# Patient Record
Sex: Female | Born: 1970 | Race: White | Hispanic: No | Marital: Single | Smoking: Former smoker
Health system: Southern US, Community
[De-identification: ages and names within clinical notes are randomized; demographics above are authoritative.]

## PROBLEM LIST (undated history)

## (undated) DIAGNOSIS — I10 Essential (primary) hypertension: Secondary | ICD-10-CM

## (undated) DIAGNOSIS — E039 Hypothyroidism, unspecified: Secondary | ICD-10-CM

## (undated) DIAGNOSIS — G35 Multiple sclerosis: Secondary | ICD-10-CM

## (undated) DIAGNOSIS — E785 Hyperlipidemia, unspecified: Secondary | ICD-10-CM

## (undated) DIAGNOSIS — E7800 Pure hypercholesterolemia, unspecified: Secondary | ICD-10-CM

## (undated) DIAGNOSIS — K219 Gastro-esophageal reflux disease without esophagitis: Secondary | ICD-10-CM

## (undated) DIAGNOSIS — K2950 Unspecified chronic gastritis without bleeding: Secondary | ICD-10-CM

## (undated) HISTORY — PX: ABDOMINAL HYSTERECTOMY: SHX81

## (undated) HISTORY — PX: TUBAL LIGATION: SHX77

## (undated) HISTORY — PX: PARTIAL HYSTERECTOMY: SHX80

---

## 2006-03-30 ENCOUNTER — Emergency Department: Payer: Self-pay | Admitting: Emergency Medicine

## 2006-09-01 ENCOUNTER — Ambulatory Visit: Payer: Self-pay | Admitting: Family Medicine

## 2006-09-03 ENCOUNTER — Ambulatory Visit: Payer: Self-pay | Admitting: Family Medicine

## 2008-01-06 ENCOUNTER — Ambulatory Visit: Payer: Self-pay | Admitting: Family Medicine

## 2008-12-21 ENCOUNTER — Ambulatory Visit: Payer: Self-pay | Admitting: Urology

## 2008-12-26 ENCOUNTER — Ambulatory Visit: Payer: Self-pay | Admitting: Urology

## 2009-01-08 ENCOUNTER — Emergency Department: Payer: Self-pay | Admitting: Unknown Physician Specialty

## 2009-02-09 ENCOUNTER — Emergency Department: Payer: Self-pay | Admitting: Emergency Medicine

## 2010-02-01 ENCOUNTER — Ambulatory Visit: Payer: Self-pay | Admitting: Unknown Physician Specialty

## 2010-02-06 LAB — PATHOLOGY REPORT

## 2010-04-05 ENCOUNTER — Ambulatory Visit: Payer: Self-pay | Admitting: Neurology

## 2011-01-21 ENCOUNTER — Ambulatory Visit: Payer: Self-pay | Admitting: Family Medicine

## 2011-01-24 ENCOUNTER — Ambulatory Visit: Payer: Self-pay | Admitting: Family Medicine

## 2011-03-29 ENCOUNTER — Emergency Department: Payer: Self-pay | Admitting: *Deleted

## 2011-03-29 LAB — COMPREHENSIVE METABOLIC PANEL
Bilirubin,Total: 0.3 mg/dL (ref 0.2–1.0)
Chloride: 110 mmol/L — ABNORMAL HIGH (ref 98–107)
Co2: 23 mmol/L (ref 21–32)
Creatinine: 1.01 mg/dL (ref 0.60–1.30)
EGFR (African American): >60
EGFR (Non-African Amer.): >60
Osmolality: 283 (ref 275–301)
Potassium: 4 mmol/L (ref 3.5–5.1)
Sodium: 142 mmol/L (ref 136–145)
Total Protein: 7.4 g/dL (ref 6.4–8.2)

## 2011-03-29 LAB — CBC
HCT: 42.8 % (ref 35.0–47.0)
MCV: 85 fL (ref 80–100)
Platelet: 273 10*3/uL (ref 150–440)
RBC: 5.01 10*6/uL (ref 3.80–5.20)
WBC: 11.1 10*3/uL — ABNORMAL HIGH (ref 3.6–11.0)

## 2011-07-29 ENCOUNTER — Ambulatory Visit: Payer: Self-pay | Admitting: Family Medicine

## 2011-08-19 ENCOUNTER — Ambulatory Visit: Payer: Self-pay | Admitting: Family Medicine

## 2011-08-30 ENCOUNTER — Other Ambulatory Visit: Payer: Self-pay | Admitting: Podiatry

## 2011-09-02 ENCOUNTER — Other Ambulatory Visit: Payer: Self-pay | Admitting: Family Medicine

## 2011-09-02 DIAGNOSIS — N6009 Solitary cyst of unspecified breast: Secondary | ICD-10-CM

## 2011-09-06 ENCOUNTER — Ambulatory Visit
Admission: RE | Admit: 2011-09-06 | Discharge: 2011-09-06 | Disposition: A | Discharge status: Home / Self Care | Payer: Medicaid Other | Source: Ambulatory Visit | Attending: Family Medicine | Admitting: Family Medicine

## 2011-09-06 DIAGNOSIS — N6009 Solitary cyst of unspecified breast: Secondary | ICD-10-CM

## 2011-09-06 MED ORDER — GADOBENATE DIMEGLUMINE 529 MG/ML IV SOLN
20.0000 mL | Freq: Once | INTRAVENOUS | Status: AC | PRN
  Administered 2011-09-06: 20 mL via INTRAVENOUS

## 2011-10-09 ENCOUNTER — Emergency Department: Payer: Self-pay | Admitting: Emergency Medicine

## 2012-01-13 ENCOUNTER — Ambulatory Visit: Payer: Self-pay | Admitting: Obstetrics & Gynecology

## 2012-01-23 ENCOUNTER — Ambulatory Visit: Payer: Self-pay | Admitting: Obstetrics & Gynecology

## 2012-01-30 ENCOUNTER — Ambulatory Visit: Payer: Self-pay | Admitting: Family Medicine

## 2014-05-31 NOTE — Op Note (Signed)
PATIENT NAME:  Katrina Murray, Katrina Murray MR#:  [id number] DATE OF BIRTH:  [birth year]  DATE OF PROCEDURE:  01/23/2012  PREOPERATIVE DIAGNOSIS: Severe dysmenorrhea.  POSTOPERATIVE DIAGNOSIS: Severe dysmenorrhea.  PROCEDURE PERFORMED: Diagnostic laparoscopy and vaginal hysterectomy.   INDICATION: A 44 year old multiparous with long history of severe dysmenorrhea.  SURGEON: Verlin Grills, MD  ASSISTANT: Vena Austria, MD  ANESTHESIA: General endotracheal.   COMPLICATIONS: None.   ESTIMATED BLOOD LOSS: 100 mL.  IV FLUIDS: 1700 mL crystalloid.   URINE OUTPUT: 250 mL, clear.  SPECIMEN: Uterus and cervix.   FINDINGS: Small anteverted uterus, normal tubes and ovaries bilaterally. No evidence of endometriosis, adhesive disease, or other etiology for pelvic pain.   MEDICATIONS: 2 grams Ancef preoperatively.  PROCEDURE IN DETAIL: The patient was taken to the Operating Room, laid supine on the operating room table, and given anesthesia via general endotracheal. She had her legs placed in [place] stirrups. She was prepped and draped in sterile fashion. Foley catheter was inserted. Speculum was inserted and the cervix was visualized and grasped anteriorly with a single-tooth tenaculum. Hulka uterine manipulator was then placed without difficulty. Speculum and tenaculum were both removed from the vagina and attention was turned to the patient's abdomen. A 5 mm incision was made vertically in the umbilical plate, with a knife, after injection of local anesthesia. Next, a Veress needle was used in order to attempt insufflation, although due to recurrent elevated entry pressure twice, decision was made to then proceed with a Visiport. The peritoneal cavity was then entered without complication using the Visiport. The abdomen was surveyed and found to have no injury or defect. Next, the uterus was visualized, noted to be normal, as well as bilateral tubes and ovaries. There was no evidence of endometriosis  or other pelvic adhesive disease. At this point, decision was made to proceed with vaginal hysterectomy. The camera was removed from the abdomen, although the trocar remained in place. Next, attention was turned to the vagina where a short-weighted speculum was placed. The cervix was grasped doubly with a single-tooth tenaculum. The cervical vaginal junction was then injected with local anesthetic containing epinephrine, circumferentially. Next, the knife was used circumferentially to incise the cervicovaginal junction. The anterior vaginal mucosa was elevated and with sharp dissection using Mayo scissors anterior entry into the peritoneal cavity was accomplished without difficulty. A lighted retractor was then placed into this opening and attention was turned to the posterior aspect and using Mayo scissors posterior colpotomy was made, again without difficulty, and a long-weighted speculum was placed. Uterosacrals were then identified bilaterally. They were clamped, cut, and suture ligated bilaterally and tags were placed. Next, the uterine pedicles were then isolated medial to previous pedicle serially up the mesosalpinx with clamping, cutting, and suture ligating until we arrived at the bilateral cornua which were isolated on either side. They were clamped, cut, and tied with free tie on a pass followed by fore and aft stitch bilaterally. Hemostasis was noted to be excellent at all sites and pedicles. Next, a modified McCall's was carried out with previously tagged uterosacrals and running of posterior peritoneum. This was held and the vaginal mucosa was then closed in a running locked fashion followed by tying off of the modified McCall's stitch. Vaginal packing was placed and attention was then returned to the abdomen. The camera was reinserted and the pelvis was again reinspected. All pedicles were found to be hemostatic. Trocar and camera were removed from the abdomen. Insufflation was allowed to release.  Skin was  closed using Dermabond and bandage with gauze. All counts were correct x2. The patient tolerated the procedure well and was taken to the [facility] unit in stable condition.  ____________________________ Ali Lowe Garnette Gunner, MD eks:slb D: 01/23/2012 10:10:10 ET    T: 01/23/2012 10:24:30 ET       JOB#: [id number] cc: Weston Settle K. Garnette Gunner, MD, <Dictator> Weston Settle Lona Kettle CLIPP MD ELECTRONICALLY SIGNED 01/28/2012 11:47

## 2015-06-06 ENCOUNTER — Other Ambulatory Visit: Payer: Self-pay | Admitting: Internal Medicine

## 2015-06-06 DIAGNOSIS — Z1231 Encounter for screening mammogram for malignant neoplasm of breast: Secondary | ICD-10-CM

## 2015-08-25 ENCOUNTER — Other Ambulatory Visit: Payer: Self-pay | Admitting: Internal Medicine

## 2015-08-25 DIAGNOSIS — Z1231 Encounter for screening mammogram for malignant neoplasm of breast: Secondary | ICD-10-CM

## 2015-08-29 ENCOUNTER — Ambulatory Visit
Admission: RE | Admit: 2015-08-29 | Discharge: 2015-08-29 | Disposition: A | Discharge status: Home / Self Care | Payer: Medicaid Other | Source: Ambulatory Visit | Attending: Internal Medicine | Admitting: Internal Medicine

## 2015-08-29 DIAGNOSIS — Z1231 Encounter for screening mammogram for malignant neoplasm of breast: Secondary | ICD-10-CM

## 2015-09-11 ENCOUNTER — Ambulatory Visit (INDEPENDENT_AMBULATORY_CARE_PROVIDER_SITE_OTHER): Payer: Medicaid Other | Admitting: Otolaryngology

## 2015-10-09 ENCOUNTER — Ambulatory Visit (INDEPENDENT_AMBULATORY_CARE_PROVIDER_SITE_OTHER): Payer: Medicaid Other | Admitting: Otolaryngology

## 2015-10-09 DIAGNOSIS — H903 Sensorineural hearing loss, bilateral: Secondary | ICD-10-CM

## 2015-10-09 DIAGNOSIS — H9313 Tinnitus, bilateral: Secondary | ICD-10-CM

## 2016-01-17 ENCOUNTER — Emergency Department
Admission: EM | Admit: 2016-01-17 | Discharge: 2016-01-17 | Disposition: A | Discharge status: Home / Self Care | Payer: Medicaid Other | Attending: Student in an Organized Health Care Education/Training Program | Admitting: Student in an Organized Health Care Education/Training Program

## 2016-01-17 ENCOUNTER — Emergency Department: Payer: Medicaid Other

## 2016-01-17 ENCOUNTER — Encounter: Payer: Self-pay | Admitting: Emergency Medicine

## 2016-01-17 DIAGNOSIS — Z87891 Personal history of nicotine dependence: Secondary | ICD-10-CM | POA: Insufficient documentation

## 2016-01-17 DIAGNOSIS — R1012 Left upper quadrant pain: Secondary | ICD-10-CM | POA: Diagnosis not present

## 2016-01-17 DIAGNOSIS — R109 Unspecified abdominal pain: Secondary | ICD-10-CM

## 2016-01-17 DIAGNOSIS — R110 Nausea: Secondary | ICD-10-CM | POA: Diagnosis not present

## 2016-01-17 DIAGNOSIS — I10 Essential (primary) hypertension: Secondary | ICD-10-CM | POA: Diagnosis not present

## 2016-01-17 HISTORY — DX: Essential (primary) hypertension: I10

## 2016-01-17 HISTORY — DX: Pure hypercholesterolemia, unspecified: E78.00

## 2016-01-17 HISTORY — DX: Multiple sclerosis: G35

## 2016-01-17 LAB — GLUCOSE, CAPILLARY: GLUCOSE-CAPILLARY: 144 mg/dL — AB (ref 65–99)

## 2016-01-17 LAB — COMPREHENSIVE METABOLIC PANEL
ALK PHOS: 55 U/L (ref 38–126)
ALT: 26 U/L (ref 14–54)
AST: 28 U/L (ref 15–41)
Albumin: 4.9 g/dL (ref 3.5–5.0)
Anion gap: 13 (ref 5–15)
BILIRUBIN TOTAL: 0.6 mg/dL (ref 0.3–1.2)
BUN: 18 mg/dL (ref 6–20)
CALCIUM: 10 mg/dL (ref 8.9–10.3)
CO2: 22 mmol/L (ref 22–32)
CREATININE: 1.49 mg/dL — AB (ref 0.44–1.00)
Chloride: 99 mmol/L — ABNORMAL LOW (ref 101–111)
GFR, EST AFRICAN AMERICAN: 48 mL/min — AB (ref >60)
GFR, EST NON AFRICAN AMERICAN: 41 mL/min — AB (ref >60)
Glucose, Bld: 132 mg/dL — ABNORMAL HIGH (ref 65–99)
Potassium: 3.6 mmol/L (ref 3.5–5.1)
Sodium: 134 mmol/L — ABNORMAL LOW (ref 135–145)
TOTAL PROTEIN: 8.3 g/dL — AB (ref 6.5–8.1)

## 2016-01-17 LAB — URINALYSIS, COMPLETE (UACMP) WITH MICROSCOPIC
Bacteria, UA: NONE SEEN
Bilirubin Urine: NEGATIVE
Glucose, UA: NEGATIVE mg/dL
Ketones, ur: 5 mg/dL — AB
LEUKOCYTES UA: NEGATIVE
NITRITE: NEGATIVE
PH: 5 (ref 5.0–8.0)
Protein, ur: 30 mg/dL — AB

## 2016-01-17 LAB — CBC
HCT: 52.7 % — ABNORMAL HIGH (ref 35.0–47.0)
Hemoglobin: 18.1 g/dL — ABNORMAL HIGH (ref 12.0–16.0)
MCH: 28.4 pg (ref 26.0–34.0)
MCHC: 34.3 g/dL (ref 32.0–36.0)
MCV: 82.7 fL (ref 80.0–100.0)
PLATELETS: 336 10*3/uL (ref 150–440)
RBC: 6.38 MIL/uL — AB (ref 3.80–5.20)
RDW: 13.8 % (ref 11.5–14.5)
WBC: 11.7 10*3/uL — AB (ref 3.6–11.0)

## 2016-01-17 LAB — LIPASE, BLOOD: LIPASE: 25 U/L (ref 11–51)

## 2016-01-17 MED ORDER — TRAMADOL HCL 50 MG PO TABS: 50.0000 mg | ORAL_TABLET | Freq: Two times a day (BID) | ORAL | 0 refills | Status: DC | PRN

## 2016-01-17 MED ORDER — IOPAMIDOL (ISOVUE-300) INJECTION 61%
75.0000 mL | Freq: Once | INTRAVENOUS | Status: AC | PRN
  Administered 2016-01-17: 75 mL via INTRAVENOUS
  Filled 2016-01-17: qty 75

## 2016-01-17 MED ORDER — ONDANSETRON HCL 4 MG PO TABS: 4.0000 mg | ORAL_TABLET | Freq: Every day | ORAL | 0 refills | Status: DC

## 2016-01-17 MED ORDER — SODIUM CHLORIDE 0.9 % IV BOLUS (SEPSIS)
1000.0000 mL | Freq: Once | INTRAVENOUS | Status: AC
  Administered 2016-01-17: 1000 mL via INTRAVENOUS

## 2016-01-17 MED ORDER — MORPHINE SULFATE (PF) 2 MG/ML IV SOLN
2.0000 mg | Freq: Once | INTRAVENOUS | Status: AC
  Administered 2016-01-17: 2 mg via INTRAVENOUS
  Filled 2016-01-17: qty 1

## 2016-01-17 MED ORDER — DOXYLAMINE SUCCINATE (SLEEP) 25 MG PO TABS
25.0000 mg | ORAL_TABLET | Freq: Once | ORAL | Status: DC
Start: 2016-01-17 — End: 2016-01-17

## 2016-01-17 MED ORDER — ONDANSETRON 4 MG PO TBDP
4.0000 mg | ORAL_TABLET | Freq: Once | ORAL | Status: AC
  Administered 2016-01-17: 4 mg via ORAL
  Filled 2016-01-17: qty 1

## 2016-01-17 NOTE — ED Triage Notes (Signed)
Pt presents with n/v and abdominal pain x 3-4 days. Denies diarrhea. Pt states it is worse today. She states that she was able to keep one meal down yesterday and then one meal today. This morning began to have back pain as well. Pt alert & oriented.

## 2016-01-17 NOTE — ED Notes (Signed)
PA aware of BP, pt has not taken BP meds today, PA aware

## 2016-01-17 NOTE — ED Provider Notes (Signed)
[facility] Emergency Department Provider Note  ____________________________________________   First MD Initiated Contact with Patient 01/17/16 1406     (approximate)  I have reviewed the triage vital signs and the nursing notes.   HISTORY  Chief Complaint Abdominal Pain    HPI Katrina Murray is a 45 y.o. female presenting with acute sharp 10/10 left upper quadrant abdominal pain for one day. Patient denies preceding factors. Associated symptoms include nausea. Patient states that she had rhinorrhea and cough for the past 3 days. She has not experienced similar episodes of abdominal pain in the past. Past abdominal surgeries include a partial hysterectomy and tubal ligation. No history of malignancy. Abdominal pain does not change with eating or changes in position. She has not attempted alleviating measures to relieve abdominal pain. Patient states that she consumes alcohol once monthly.  She denies hematemesis, hematuria, hematochezia, diarrhea, constipation and fever. She denies recent travel.   Past Medical History:  Diagnosis Date  . Hypercholesteremia   . Hypertension   . Multiple sclerosis (HCC)     There are no active problems to display for this patient.   Past Surgical History:  Procedure Laterality Date  . PARTIAL HYSTERECTOMY    . TUBAL LIGATION      Prior to Admission medications   Medication Sig Start Date End Date Taking? Authorizing Provider  ondansetron (ZOFRAN) 4 MG tablet Take 1 tablet (4 mg total) by mouth daily. 01/17/16   Orvil Feil, PA-C  traMADol (ULTRAM) 50 MG tablet Take 1 tablet (50 mg total) by mouth every 12 (twelve) hours as needed. 01/17/16 01/16/17  Orvil Feil, PA-C    Allergies Patient has no known allergies.  Family History  Problem Relation Age of Onset  . Breast cancer Maternal Aunt     Social History Social History  Substance Use Topics  . Smoking status: Former Games developer  . Smokeless tobacco:  Never Used  . Alcohol use Not on file     Comment: occasional    Review of Systems Constitutional: No fever/chills Eyes: No visual changes. ENT: No sore throat. Cardiovascular: Denies chest pain. Respiratory: Denies shortness of breath. Gastrointestinal: Has LUQ abdominal pain. Nausea but no vomiting. Genitourinary: Negative for dysuria. Musculoskeletal: Negative for back pain. Skin: Negative for rash. Neurological: Negative for headaches, focal weakness or numbness.  10-point ROS otherwise negative.  ____________________________________________   PHYSICAL EXAM:  VITAL SIGNS: ED Triage Vitals  Enc Vitals Group     BP 01/17/16 1216 (!) 131/111     Pulse Rate 01/17/16 1216 (!) 126     Resp 01/17/16 1216 20     Temp 01/17/16 1216 97.6 F (36.4 C)     Temp Source 01/17/16 1216 Oral     SpO2 01/17/16 1216 96 %     Weight 01/17/16 1217 277 lb (125.6 kg)     Height 01/17/16 1217 5\' 9"  (1.753 m)     Head Circumference --      Peak Flow --      Pain Score 01/17/16 1217 9     Pain Loc --      Pain Edu? --      Excl. in GC? --    Constitutional: Patient is lying supine on the bed with her head covered by a blanket.  Eyes: Conjunctivae are normal. PERRL. EOMI. photophobia, secondary to MS. Head: Atraumatic. Nose: Nasal septum midline. Turbinates erythematous bilaterally. Mouth/Throat: Mucous membranes are moist.  Posterior pharynx is without erythema, exudate  or petechiae. Tonsils 2+, uvula midline. Neck: Full range of motion. No palpable cervical lymphadenopathy. Cardiovascular: Palpable radial, ulnar and dorsalis pedal pulses. Regular rate and rhythm without gallops, murmurs or rubs. Respiratory: Normal respiratory effort.  No retractions. Lungs CTAB. Gastrointestinal: Mild striae visualized. Patient has tenderness to light palpation in the left upper quadrant with guarding. Positive bowel sounds in all 4 quadrants. No tenderness in the right and left lower quadrants. No  venous hums auscultated. No palpable spleen. Genitourinary: No suprapubic or costovertebral angle tenderness. Musculoskeletal: No lower extremity tenderness nor edema.  No joint effusions. Neurologic:  Normal speech and language.  Skin:  Skin is warm, dry and intact. No rash noted.  ____________________________________________   LABS (all labs ordered are listed, but only abnormal results are displayed)  Labs Reviewed  COMPREHENSIVE METABOLIC PANEL - Abnormal; Notable for the following:       Result Value   Sodium 134 (*)    Chloride 99 (*)    Glucose, Bld 132 (*)    Creatinine, Ser 1.49 (*)    Total Protein 8.3 (*)    GFR calc non Af Amer 41 (*)    GFR calc Af Amer 48 (*)    All other components within normal limits  CBC - Abnormal; Notable for the following:    WBC 11.7 (*)    RBC 6.38 (*)    Hemoglobin 18.1 (*)    HCT 52.7 (*)    All other components within normal limits  GLUCOSE, CAPILLARY - Abnormal; Notable for the following:    Glucose-Capillary 144 (*)    All other components within normal limits  LIPASE, BLOOD  URINALYSIS, COMPLETE (UACMP) WITH MICROSCOPIC   ____________________________________________   RADIOLOGY  DG Chest Normal chest.  CT Abdomen  IMPRESSION:  No acute finding to explain abdominal pain, nausea or vomiting.    Multiple benign appearing liver cysts. Few benign appearing renal  cysts.    Aortic atherosclerosis.    3 cm cyst either of the vagina or the periurethral region. This  could also be a urethral diverticulum.    Chronic bilateral pars defects at L5.     I, Orvil Feil, personally viewed and evaluated these images as part of my medical decision making, as well as reviewing the written report by the radiologist.   PROCEDURES  Procedures Morphine  IV bolus - Normal Saline  Zofran    INITIAL IMPRESSION / ASSESSMENT AND PLAN / ED COURSE  Pertinent labs & imaging results that were available during my care of the  patient were reviewed by me and considered in my medical decision making (see chart for details).    Clinical Course    Assessment and plan: Patient presents with 10 out of 10 left upper quadrant abdominal pain.Patient is tachycardic and hypertensive with a WBC 11.7. Differential diagnosis originally included pancreatitis, diverticulitis, bowel obstruction and gastroenteritis. Lipase within reference range, decreasing suspicion for pancreatitis. Patient underwent a CT abdomen which did not present findings to explain abdominal pain, nausea or vomiting. DG chest was within the parameters of normal. Patient received fluids, Zofran and an an injection of morphine in the emergency department.   ----------------------------------------- 4:32 PM on 01/17/2016 -----------------------------------------  I checked on patient after morphine injection. Patient states that abdominal pain is 0 out of 10. Patient was advised to follow-up with her primary care provider regarding likely benign liver cyst localized on CT abdomen. Patient was discharged with  a five-day course of tramadol and Zofran to  be used as needed for pain and nausea. Strict return precautions were given.   ----------------------------------------- 4:55 PM on 01/17/2016 -----------------------------------------  I consulted with Zollie Scalelivia, RN collaborating team member for patient care. Heart rate is now within normal range. Patient remains hypertensive. However she has hypertension at baseline and normally takes her blood pressure medication at night. Vital signs are otherwise stable.  ----------------------------------------- 5:24 PM on 01/17/2016 -----------------------------------------  I just checked on patient. Her abdominal pain is still 0 out of 10. Encouraged hydration at home given UA results.   FINAL CLINICAL IMPRESSION(S) / ED DIAGNOSES  Final diagnoses:  Left upper quadrant pain      NEW MEDICATIONS STARTED DURING  THIS VISIT:  New Prescriptions   ONDANSETRON (ZOFRAN) 4 MG TABLET    Take 1 tablet (4 mg total) by mouth daily.   TRAMADOL (ULTRAM) 50 MG TABLET    Take 1 tablet (50 mg total) by mouth every 12 (twelve) hours as needed.     Note:  This document was prepared using Dragon voice recognition software and may include unintentional dictation errors.    Orvil FeilJaclyn M Smayan Hackbart, PA-C 01/17/16 1755    Willy EddyPatrick Robinson, MD 01/17/16 [phone]

## 2016-01-17 NOTE — ED Notes (Signed)
Pt states nasal congestion with cough for several days, states abd pain that began yesterday with nausea, denies any vomiting, pt awake and alert in no acute distress

## 2016-02-26 ENCOUNTER — Ambulatory Visit: Payer: Medicaid Other | Admitting: Urology

## 2016-02-26 ENCOUNTER — Encounter: Payer: Self-pay | Admitting: Urology

## 2016-02-26 VITALS — BP 122/80 | HR 62 | Ht 69.0 in | Wt 273.0 lb

## 2016-02-26 DIAGNOSIS — N393 Stress incontinence (female) (male): Secondary | ICD-10-CM

## 2016-02-26 DIAGNOSIS — N3946 Mixed incontinence: Secondary | ICD-10-CM

## 2016-02-26 LAB — MICROSCOPIC EXAMINATION: BACTERIA UA: NONE SEEN

## 2016-02-26 LAB — URINALYSIS, COMPLETE
BILIRUBIN UA: NEGATIVE
Glucose, UA: NEGATIVE
Ketones, UA: NEGATIVE
Leukocytes, UA: NEGATIVE
Nitrite, UA: NEGATIVE
PH UA: 6 (ref 5.0–7.5)
Protein, UA: NEGATIVE
Urobilinogen, Ur: 0.2 mg/dL (ref 0.2–1.0)

## 2016-02-26 LAB — BLADDER SCAN AMB NON-IMAGING: Scan Result: 19

## 2016-02-26 NOTE — Progress Notes (Signed)
02/26/2016 10:50 AM   Katrina Murray Nov 16, 1970 [id number]  Referring provider: [facility] [address] [place], [place] [address]  Chief Complaint  Patient presents with  . Urinary Incontinence    HPI: I was consulted to assess the patient's urinary incontinence worsening over many years. She reports some sort of procedure but no surgery by Dr. Sheppard Penton in the past.  Currently she leaks with coughing sneezing and sometimes bending and lifting. She has urge incontinence. She believes a stress incontinence is worse. She does have mild to moderate bedwetting. She wears 3 pads a day sometimes almost moderately wet.  She voids 4-5 times a day with no ankle edema but drinks a lot of fluids. She voids every 2 hours and has a reasonable flow and does feel empty  She was diagnosed with multiple sclerosis in 2003 and does well with that. She has had a hysterectomy  Her bowel movements are normal. She does not give bladder infections. The presentation has not been medically treated     PMH: Past Medical History:  Diagnosis Date  . Hypercholesteremia   . Hypertension   . Multiple sclerosis [facility])     Surgical History: Past Surgical History:  Procedure Laterality Date  . ABDOMINAL HYSTERECTOMY    . PARTIAL HYSTERECTOMY    . TUBAL LIGATION      Home Medications:  Allergies as of 02/26/2016   No Known Allergies     Medication List       Accurate as of 02/26/16 10:50 AM. Always use your most recent med list.          glatiramer 20 MG/ML Sosy injection Commonly known as:  COPAXONE Inject 20 mg into the skin 3 (three) times a week.   hydrochlorothiazide 25 MG tablet Commonly known as:  HYDRODIURIL Take 25 mg by mouth daily.   levothyroxine 25 MCG tablet Commonly known as:  SYNTHROID, LEVOTHROID Take by mouth.   lisinopril 40 MG tablet Commonly known as:  PRINIVIL,ZESTRIL Take by mouth.   omeprazole 20 MG capsule Commonly known as:   PRILOSEC Take by mouth.   ondansetron 4 MG tablet Commonly known as:  ZOFRAN Take 1 tablet (4 mg total) by mouth daily.   simvastatin 40 MG tablet Commonly known as:  ZOCOR Take by mouth.   topiramate 25 MG tablet Commonly known as:  TOPAMAX Take by mouth.   traMADol 50 MG tablet Commonly known as:  ULTRAM Take 1 tablet (50 mg total) by mouth every 12 (twelve) hours as needed.       Allergies: No Known Allergies  Family History: Family History  Problem Relation Age of Onset  . Breast cancer Maternal Aunt   . Bladder Cancer Neg Hx   . Kidney cancer Neg Hx     Social History:  reports that she has quit smoking. She has never used smokeless tobacco. She reports that she does not drink alcohol or use drugs.  ROS: UROLOGY Frequent Urination?: Yes Hard to postpone urination?: No Burning/pain with urination?: No Get up at night to urinate?: Yes Leakage of urine?: Yes Urine stream starts and stops?: No Trouble starting stream?: No Do you have to strain to urinate?: No Blood in urine?: No Urinary tract infection?: No Sexually transmitted disease?: No Injury to kidneys or bladder?: Yes Painful intercourse?: No Weak stream?: Yes Currently pregnant?: Yes Vaginal bleeding?: No Last menstrual period?: n  Gastrointestinal Nausea?: Yes Vomiting?: No Indigestion/heartburn?: Yes Diarrhea?: No Constipation?: No  Constitutional  Fever: No Night sweats?: No Weight loss?: No Fatigue?: No  Skin Skin rash/lesions?: No Itching?: No  Eyes Blurred vision?: Yes Double vision?: No  Ears/Nose/Throat Sore throat?: No Sinus problems?: No  Hematologic/Lymphatic Swollen glands?: No Easy bruising?: Yes  Cardiovascular Leg swelling?: No Chest pain?: No  Respiratory Cough?: No Shortness of breath?: No  Endocrine Excessive thirst?: Yes  Musculoskeletal Back pain?: Yes Joint pain?: Yes  Neurological Headaches?: Yes Dizziness?:  No  Psychologic Depression?: No Anxiety?: No  Physical Exam: BP 122/80 (BP Location: Left Arm, Patient Position: Sitting, Cuff Size: Large)   Pulse 62   Ht 5\' 9"  (1.753 m)   Wt 273 lb (123.8 kg)   BMI 40.32 kg/m   Constitutional:  Alert and oriented, No acute distress. HEENT: [place] AT, moist mucus membranes.  Trachea midline, no masses. Cardiovascular: No clubbing, cyanosis, or edema. Respiratory: Normal respiratory effort, no increased work of breathing. GI: Abdomen is soft, nontender, nondistended, no abdominal masses GU: No CVA tenderness. The patient had mild grade 2 hypermobility of the bladder neck and no stress incontinence. She had a grade 1 high cystocele. She had an asymptomatic grade 1 distal rectocele. Skin: No rashes, bruises or suspicious lesions. Lymph: No cervical or inguinal adenopathy. Neurologic: Grossly intact, no focal deficits, moving all 4 extremities. Psychiatric: Normal mood and affect.  Laboratory Data: Lab Results  Component Value Date   WBC 11.7 (H) 01/17/2016   HGB 18.1 (H) 01/17/2016   HCT 52.7 (H) 01/17/2016   MCV 82.7 01/17/2016   PLT 336 01/17/2016    Lab Results  Component Value Date   CREATININE 1.49 (H) 01/17/2016    No results found for: PSA  No results found for: TESTOSTERONE  No results found for: HGBA1C  Urinalysis    Component Value Date/Time   COLORURINE YELLOW (A) 01/17/2016 1642   APPEARANCEUR CLEAR (A) 01/17/2016 1642   LABSPEC >1.046 (H) 01/17/2016 1642   PHURINE 5.0 01/17/2016 1642   GLUCOSEU NEGATIVE 01/17/2016 1642   HGBUR MODERATE (A) 01/17/2016 1642   BILIRUBINUR NEGATIVE 01/17/2016 1642   KETONESUR 5 (A) 01/17/2016 1642   PROTEINUR 30 (A) 01/17/2016 1642   NITRITE NEGATIVE 01/17/2016 1642   LEUKOCYTESUR NEGATIVE 01/17/2016 1642    Pertinent Imaging: none  Assessment & Plan:  The patient has mixed incontinence. She has bedwetting. She has multiple sclerosis and is at high risk of a neurogenic bladder. She  has significant nighttime frequency and milder daytime frequency.  The physiology of urinary incontinence was discussed. The role of urodynamics was discussed. She does have significant stress incontinence but with multiple sclerosis this may be less ideal of a therapy for her. There is no question she has an overactive bladder  UDS ordered  1. Stress incontinence, female 2. Mixed incontinence 3. Neurogenic bladder  - Bladder Scan (Post Void Residual) in office - Urinalysis, Complete   No Follow-up on file.  Martina Sinner, MD  [facility] [address], [address] [place], [place] [address] [phone]

## 2016-03-05 ENCOUNTER — Other Ambulatory Visit: Payer: Self-pay | Admitting: Internal Medicine

## 2016-03-06 ENCOUNTER — Other Ambulatory Visit: Payer: Self-pay | Admitting: Internal Medicine

## 2016-03-06 DIAGNOSIS — R1010 Upper abdominal pain, unspecified: Secondary | ICD-10-CM

## 2016-03-11 ENCOUNTER — Other Ambulatory Visit: Payer: Self-pay | Admitting: Urology

## 2016-03-13 ENCOUNTER — Ambulatory Visit
Admission: RE | Admit: 2016-03-13 | Discharge: 2016-03-13 | Disposition: A | Discharge status: Home / Self Care | Payer: Medicaid Other | Source: Ambulatory Visit | Attending: Internal Medicine | Admitting: Internal Medicine

## 2016-03-13 DIAGNOSIS — R1010 Upper abdominal pain, unspecified: Secondary | ICD-10-CM | POA: Insufficient documentation

## 2016-03-13 DIAGNOSIS — K7689 Other specified diseases of liver: Secondary | ICD-10-CM | POA: Diagnosis not present

## 2016-03-27 ENCOUNTER — Ambulatory Visit (INDEPENDENT_AMBULATORY_CARE_PROVIDER_SITE_OTHER): Payer: Medicaid Other | Admitting: Urology

## 2016-03-27 VITALS — BP 118/83 | HR 67 | Ht 69.0 in | Wt 274.3 lb

## 2016-03-27 DIAGNOSIS — N3946 Mixed incontinence: Secondary | ICD-10-CM | POA: Diagnosis not present

## 2016-03-27 DIAGNOSIS — N393 Stress incontinence (female) (male): Secondary | ICD-10-CM

## 2016-03-27 MED ORDER — SOLIFENACIN SUCCINATE 5 MG PO TABS: 5.0000 mg | ORAL_TABLET | Freq: Every day | ORAL | 11 refills | Status: DC

## 2016-03-27 NOTE — Progress Notes (Signed)
03/27/2016 9:15 AM   Katrina Murray 01-16-1971 [id number]  Referring provider: Alvina Filbert, MD [address] [place], [place] [address]  Chief Complaint  Patient presents with  . Follow-up    UDS results     HPI: I was consulted to assess the patient's urinary incontinence worsening over many years. She reports some sort of procedure but no surgery by Dr. Sheppard Penton in the past.  Currently she leaks with coughing sneezing and sometimes bending and lifting. She has urge incontinence. She believes a stress incontinence is worse. She does have mild to moderate bedwetting. She wears 3 pads a day sometimes almost moderately wet.  She voids 4-5 times a day with no ankle edema but drinks a lot of fluids. She voids every 2 hours and has a reasonable flow and does feel empty  She was diagnosed with multiple sclerosis in 2003 and does well with that. She has had a hysterectomy  The patient has mixed incontinence. She has bedwetting. She has multiple sclerosis and is at high risk of a neurogenic bladder. She has significant nighttime frequency and milder daytime frequency.  She does have significant stress incontinence but with multiple sclerosis this may be less ideal of a therapy for her. There is no question she has an overactive bladder  Today Frequency and incontinence are stable During urodynamics the patient's bladder capacity was 600 mL and the bladder was stable. Her cough leak point pressure at 200 mL was 47 cm water with very mild leakage. During voluntary voiding she was generating contractions up to 26 cm water that were not well sustained and she was not able to generate a flow. EMG activity was normal. Bladder neck descended 1 cm. The patient noted to Steward Drone that sometimes she has to wait longer and get a strong urge before she is able to void. The voiding phase was quite impressive with one contraction after another but she was unable to void. Fluoroscopically the bladder  looked normal I'm not convinced that she had fluoroscopic evidence of external sphincter dyssynergia but there was some suggestion of it. The bladder neck was open with abrupt cut off near the mid to distal urethra     Past Medical History:  Diagnosis Date  . Hypercholesteremia   . Hypertension   . Multiple sclerosis [facility])     Surgical History: Past Surgical History:  Procedure Laterality Date  . ABDOMINAL HYSTERECTOMY    . PARTIAL HYSTERECTOMY    . TUBAL LIGATION      Home Medications:  Allergies as of 03/27/2016   No Known Allergies     Medication List       Accurate as of 03/27/16  9:15 AM. Always use your most recent med list.          glatiramer 20 MG/ML Sosy injection Commonly known as:  COPAXONE Inject 20 mg into the skin 3 (three) times a week.   hydrochlorothiazide 25 MG tablet Commonly known as:  HYDRODIURIL Take 25 mg by mouth daily.   levothyroxine 25 MCG tablet Commonly known as:  SYNTHROID, LEVOTHROID Take by mouth.   lisinopril 40 MG tablet Commonly known as:  PRINIVIL,ZESTRIL Take by mouth.   omeprazole 20 MG capsule Commonly known as:  PRILOSEC Take by mouth.   ondansetron 4 MG tablet Commonly known as:  ZOFRAN Take 1 tablet (4 mg total) by mouth daily.   simvastatin 40 MG tablet Commonly known as:  ZOCOR Take by mouth.   topiramate 25 MG  tablet Commonly known as:  TOPAMAX Take by mouth.   traMADol 50 MG tablet Commonly known as:  ULTRAM Take 1 tablet (50 mg total) by mouth every 12 (twelve) hours as needed.       Allergies: No Known Allergies  Family History: Family History  Problem Relation Age of Onset  . Breast cancer Maternal Aunt   . Bladder Cancer Neg Hx   . Kidney cancer Neg Hx     Social History:  reports that she has quit smoking. She has never used smokeless tobacco. She reports that she does not drink alcohol or use drugs.  ROS: UROLOGY Frequent Urination?: Yes Hard to postpone urination?:  No Burning/pain with urination?: No Get up at night to urinate?: Yes Leakage of urine?: Yes Urine stream starts and stops?: No Trouble starting stream?: No Do you have to strain to urinate?: No Blood in urine?: No Urinary tract infection?: No Sexually transmitted disease?: No Injury to kidneys or bladder?: No Painful intercourse?: Yes Weak stream?: Yes Currently pregnant?: No Vaginal bleeding?: No Last menstrual period?: n  Gastrointestinal Nausea?: No Vomiting?: No Indigestion/heartburn?: No Diarrhea?: No Constipation?: No  Constitutional Fever: No Night sweats?: No Weight loss?: No Fatigue?: No  Skin Skin rash/lesions?: No Itching?: No  Eyes Blurred vision?: Yes Double vision?: No  Ears/Nose/Throat Sore throat?: No Sinus problems?: No  Hematologic/Lymphatic Swollen glands?: No Easy bruising?: Yes  Cardiovascular Leg swelling?: No Chest pain?: No  Respiratory Cough?: No Shortness of breath?: No  Endocrine Excessive thirst?: Yes  Musculoskeletal Back pain?: Yes Joint pain?: Yes  Neurological Headaches?: Yes Dizziness?: No  Psychologic Depression?: No Anxiety?: No  Physical Exam: BP 118/83   Pulse 67   Ht 5\' 9"  (1.753 m)   Wt 124.4 kg (274 lb 4.8 oz)   BMI 40.51 kg/m     Laboratory Data: Lab Results  Component Value Date   WBC 11.7 (H) 01/17/2016   HGB 18.1 (H) 01/17/2016   HCT 52.7 (H) 01/17/2016   MCV 82.7 01/17/2016   PLT 336 01/17/2016    Lab Results  Component Value Date   CREATININE 1.49 (H) 01/17/2016    No results found for: PSA  No results found for: TESTOSTERONE  No results found for: HGBA1C  Urinalysis    Component Value Date/Time   COLORURINE YELLOW (A) 01/17/2016 1642   APPEARANCEUR Hazy (A) 02/26/2016 1020   LABSPEC >1.046 (H) 01/17/2016 1642   PHURINE 5.0 01/17/2016 1642   GLUCOSEU Negative 02/26/2016 1020   HGBUR MODERATE (A) 01/17/2016 1642   BILIRUBINUR Negative 02/26/2016 1020   KETONESUR 5  (A) 01/17/2016 1642   PROTEINUR Negative 02/26/2016 1020   PROTEINUR 30 (A) 01/17/2016 1642   NITRITE Negative 02/26/2016 1020   NITRITE NEGATIVE 01/17/2016 1642   LEUKOCYTESUR Negative 02/26/2016 1020    Pertinent Imaging: none  Assessment & Plan:  The patient has a mild outlet abnormality and by history has urge incontinence and bedwetting and likely has a neurogenic bladder. 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 She does have a low pressure bladder. In my opinion she should not have a sling and would be at very high risk of long-term urinary retention. I urethral injectable would be a better option with the rare risk of her retention. I will hopefully help her with medical and behavioral therapy  Did not refer to physical therapy because she has Medicaid and she chose not to have one visit. Generic medication  may be utilized in the future  Patient started Vesicare samples and prescription and reassess in 4-6 weeks  There are no diagnoses linked to this encounter.  No Follow-up on file.  Martina Sinner, MD  [facility] [address] [place], [place] [address] [phone]

## 2016-03-27 NOTE — Progress Notes (Signed)
vesicare 

## 2016-04-29 ENCOUNTER — Ambulatory Visit (INDEPENDENT_AMBULATORY_CARE_PROVIDER_SITE_OTHER): Payer: Medicaid Other | Admitting: Urology

## 2016-04-29 VITALS — BP 125/78 | HR 55 | Ht 69.0 in | Wt 274.3 lb

## 2016-04-29 DIAGNOSIS — N393 Stress incontinence (female) (male): Secondary | ICD-10-CM | POA: Diagnosis not present

## 2016-04-29 DIAGNOSIS — N3946 Mixed incontinence: Secondary | ICD-10-CM

## 2016-04-29 NOTE — Progress Notes (Signed)
04/29/2016 10:00 AM   Katrina Murray 1970-03-19 [id number]  Referring provider: Alvina Filbert, MD [address] [place], [place] [address]  Chief Complaint  Patient presents with  . Follow-up    stress incontinence     HPI: I dictated a long note 03/27/2016 Currently she leaks with coughing sneezing and sometimes bending and lifting. She has urge incontinence. She believes a stress incontinence is worse. She does have mild to moderate bedwetting. She wears 3 pads a day sometimes almost moderately wet.  She voids 4-5 times a day with no ankle edema but drinks a lot of fluids. She voids every 2 hours and has a reasonable flow and does feel empty  She was diagnosed with multiple sclerosis in 2003 and does well with that.   During urodynamics the patient's bladder capacity was 600 mL and the bladder was stable. Her cough leak point pressure at 200 mL was 47 cm water with very mild leakage. During voluntary voiding she was generating contractions up to 26 cm water that were not well sustained and she was not able to generate a flow. EMG activity was normal. Bladder neck descended 1 cm. The patient noted to Steward Drone that sometimes she has to wait longer and get a strong urge before she is able to void. The voiding phase was quite impressive with one contraction after another but she was unable to void. Fluoroscopically the bladder looked normal I'm not convinced that she had fluoroscopic evidence of external sphincter dyssynergia but there was some suggestion of it. The bladder neck was open with abrupt cut off near the mid to distal urethra  The patient has a mild outlet abnormality and by history has urge incontinence and bedwetting and likely has a neurogenic bladder. 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 She does have a low pressure bladder. In my opinion she should not have a sling and would be at very high  risk of long-term urinary retention. I urethral injectable would be a better option with the rare risk of her retention.  Today Frequency improved. Less incontinence during the day. Nighttime frequency now only twice and much improved. She thinks it may or may not be causing some constipation but it also may be diet related  Clinically noninfected today   PMH: Past Medical History:  Diagnosis Date  . Hypercholesteremia   . Hypertension   . Multiple sclerosis [facility])     Surgical History: Past Surgical History:  Procedure Laterality Date  . ABDOMINAL HYSTERECTOMY    . PARTIAL HYSTERECTOMY    . TUBAL LIGATION      Home Medications:  Allergies as of 04/29/2016   No Known Allergies     Medication List       Accurate as of 04/29/16 10:00 AM. Always use your most recent med list.          glatiramer 20 MG/ML Sosy injection Commonly known as:  COPAXONE Inject 20 mg into the skin 3 (three) times a week.   hydrochlorothiazide 25 MG tablet Commonly known as:  HYDRODIURIL Take 25 mg by mouth daily.   levothyroxine 25 MCG tablet Commonly known as:  SYNTHROID, LEVOTHROID Take by mouth.   lisinopril 40 MG tablet Commonly known as:  PRINIVIL,ZESTRIL Take by mouth.   omeprazole 20 MG capsule Commonly known as:  PRILOSEC Take by mouth.   ondansetron 4 MG tablet Commonly known as:  ZOFRAN Take 1 tablet (4  mg total) by mouth daily.   simvastatin 40 MG tablet Commonly known as:  ZOCOR Take by mouth.   solifenacin 5 MG tablet Commonly known as:  VESICARE Take 1 tablet (5 mg total) by mouth daily.   topiramate 25 MG tablet Commonly known as:  TOPAMAX Take by mouth.   traMADol 50 MG tablet Commonly known as:  ULTRAM Take 1 tablet (50 mg total) by mouth every 12 (twelve) hours as needed.       Allergies: No Known Allergies  Family History: Family History  Problem Relation Age of Onset  . Breast cancer Maternal Aunt   . Bladder Cancer Neg Hx   . Kidney cancer  Neg Hx     Social History:  reports that she has quit smoking. She has never used smokeless tobacco. She reports that she does not drink alcohol or use drugs.  ROS: UROLOGY Frequent Urination?: Yes Hard to postpone urination?: No Burning/pain with urination?: No Get up at night to urinate?: Yes Leakage of urine?: No Urine stream starts and stops?: No Trouble starting stream?: No Do you have to strain to urinate?: No Blood in urine?: No Urinary tract infection?: No Sexually transmitted disease?: No Injury to kidneys or bladder?: No Painful intercourse?: Yes Weak stream?: No Currently pregnant?: No Vaginal bleeding?: No Last menstrual period?: n  Gastrointestinal Nausea?: No Vomiting?: No Indigestion/heartburn?: No Diarrhea?: No Constipation?: No  Constitutional Fever: No Night sweats?: No Weight loss?: No Fatigue?: No  Skin Skin rash/lesions?: No Itching?: No  Eyes Blurred vision?: Yes Double vision?: Yes  Ears/Nose/Throat Sore throat?: No Sinus problems?: No  Hematologic/Lymphatic Swollen glands?: No Easy bruising?: Yes  Cardiovascular Leg swelling?: No Chest pain?: No  Respiratory Cough?: No Shortness of breath?: No  Endocrine Excessive thirst?: Yes  Musculoskeletal Back pain?: Yes Joint pain?: Yes  Neurological Headaches?: Yes Dizziness?: Yes  Psychologic Depression?: No Anxiety?: No  Physical Exam: BP 125/78   Pulse (!) 55   Ht 5\' 9"  (1.753 m)   Wt 124.4 kg (274 lb 4.8 oz)   BMI 40.51 kg/m   Constitutional:  Alert and oriented, No acute distress.   Laboratory Data: Lab Results  Component Value Date   WBC 11.7 (H) 01/17/2016   HGB 18.1 (H) 01/17/2016   HCT 52.7 (H) 01/17/2016   MCV 82.7 01/17/2016   PLT 336 01/17/2016    Lab Results  Component Value Date   CREATININE 1.49 (H) 01/17/2016    No results found for: PSA  No results found for: TESTOSTERONE  No results found for: HGBA1C  Urinalysis    Component  Value Date/Time   COLORURINE YELLOW (A) 01/17/2016 1642   APPEARANCEUR Hazy (A) 02/26/2016 1020   LABSPEC >1.046 (H) 01/17/2016 1642   PHURINE 5.0 01/17/2016 1642   GLUCOSEU Negative 02/26/2016 1020   HGBUR MODERATE (A) 01/17/2016 1642   BILIRUBINUR Negative 02/26/2016 1020   KETONESUR 5 (A) 01/17/2016 1642   PROTEINUR Negative 02/26/2016 1020   PROTEINUR 30 (A) 01/17/2016 1642   NITRITE Negative 02/26/2016 1020   NITRITE NEGATIVE 01/17/2016 1642   LEUKOCYTESUR Negative 02/26/2016 1020    Pertinent Imaging: none  Assessment & Plan:  The patient has the above diagnoses. We both decided to keep her on the Vesicare 5 mg and she is at least 40% better. I will reassess efficacy and side effects next visit  1. SUI (stress urinary incontinence, female) 2. Mixed incontinence 3. Nighttime frequency   No Follow-up on file.  Martina Sinner, MD  [facility] [address] [place], [place] [address] [phone]

## 2016-06-04 ENCOUNTER — Ambulatory Visit: Payer: Medicaid Other | Admitting: Urology

## 2016-06-24 ENCOUNTER — Emergency Department: Payer: Medicaid Other

## 2016-06-24 ENCOUNTER — Emergency Department
Admission: EM | Admit: 2016-06-24 | Discharge: 2016-06-24 | Disposition: A | Discharge status: Home / Self Care | Payer: Medicaid Other | Attending: Emergency Medicine | Admitting: Emergency Medicine

## 2016-06-24 ENCOUNTER — Encounter: Payer: Self-pay | Admitting: *Deleted

## 2016-06-24 DIAGNOSIS — Z87891 Personal history of nicotine dependence: Secondary | ICD-10-CM | POA: Diagnosis not present

## 2016-06-24 DIAGNOSIS — R1011 Right upper quadrant pain: Secondary | ICD-10-CM

## 2016-06-24 DIAGNOSIS — I10 Essential (primary) hypertension: Secondary | ICD-10-CM | POA: Diagnosis not present

## 2016-06-24 DIAGNOSIS — Z79899 Other long term (current) drug therapy: Secondary | ICD-10-CM | POA: Insufficient documentation

## 2016-06-24 DIAGNOSIS — R1010 Upper abdominal pain, unspecified: Secondary | ICD-10-CM

## 2016-06-24 DIAGNOSIS — R1013 Epigastric pain: Secondary | ICD-10-CM | POA: Diagnosis not present

## 2016-06-24 LAB — URINALYSIS, COMPLETE (UACMP) WITH MICROSCOPIC
BACTERIA UA: NONE SEEN
BILIRUBIN URINE: NEGATIVE
Glucose, UA: NEGATIVE mg/dL
KETONES UR: NEGATIVE mg/dL
LEUKOCYTES UA: NEGATIVE
Nitrite: NEGATIVE
PH: 5 (ref 5.0–8.0)
PROTEIN: NEGATIVE mg/dL
Specific Gravity, Urine: 1.015 (ref 1.005–1.030)

## 2016-06-24 LAB — COMPREHENSIVE METABOLIC PANEL
ALK PHOS: 42 U/L (ref 38–126)
ALT: 21 U/L (ref 14–54)
AST: 18 U/L (ref 15–41)
Albumin: 4.1 g/dL (ref 3.5–5.0)
Anion gap: 5 (ref 5–15)
BUN: 16 mg/dL (ref 6–20)
CALCIUM: 9 mg/dL (ref 8.9–10.3)
CO2: 21 mmol/L — AB (ref 22–32)
CREATININE: 1.22 mg/dL — AB (ref 0.44–1.00)
Chloride: 110 mmol/L (ref 101–111)
GFR, EST NON AFRICAN AMERICAN: 52 mL/min — AB (ref >60)
Glucose, Bld: 98 mg/dL (ref 65–99)
Potassium: 3.9 mmol/L (ref 3.5–5.1)
SODIUM: 136 mmol/L (ref 135–145)
Total Bilirubin: 0.5 mg/dL (ref 0.3–1.2)
Total Protein: 7.4 g/dL (ref 6.5–8.1)

## 2016-06-24 LAB — CBC
HEMATOCRIT: 42.3 % (ref 35.0–47.0)
Hemoglobin: 14.6 g/dL (ref 12.0–16.0)
MCH: 28.5 pg (ref 26.0–34.0)
MCHC: 34.4 g/dL (ref 32.0–36.0)
MCV: 82.7 fL (ref 80.0–100.0)
PLATELETS: 298 10*3/uL (ref 150–440)
RBC: 5.12 MIL/uL (ref 3.80–5.20)
RDW: 14.1 % (ref 11.5–14.5)
WBC: 10.3 10*3/uL (ref 3.6–11.0)

## 2016-06-24 LAB — LIPASE, BLOOD: LIPASE: 29 U/L (ref 11–51)

## 2016-06-24 MED ORDER — IOPAMIDOL (ISOVUE-300) INJECTION 61%
30.0000 mL | Freq: Once | INTRAVENOUS | Status: AC | PRN
Start: 2016-06-24 — End: 2016-06-24
  Administered 2016-06-24: 30 mL via ORAL
  Filled 2016-06-24: qty 30

## 2016-06-24 MED ORDER — HYOSCYAMINE SULFATE SL 0.125 MG SL SUBL: 0.1250 | SUBLINGUAL_TABLET | SUBLINGUAL | 0 refills | Status: DC | PRN

## 2016-06-24 MED ORDER — PROMETHAZINE HCL 25 MG/ML IJ SOLN
12.5000 mg | Freq: Once | INTRAMUSCULAR | Status: AC
  Administered 2016-06-24: 12.5 mg via INTRAVENOUS
  Filled 2016-06-24: qty 1

## 2016-06-24 MED ORDER — MORPHINE SULFATE (PF) 4 MG/ML IV SOLN
4.0000 mg | Freq: Once | INTRAVENOUS | Status: AC
  Administered 2016-06-24: 4 mg via INTRAVENOUS
  Filled 2016-06-24: qty 1

## 2016-06-24 MED ORDER — IOPAMIDOL (ISOVUE-300) INJECTION 61%
100.0000 mL | Freq: Once | INTRAVENOUS | Status: AC | PRN
  Administered 2016-06-24: 100 mL via INTRAVENOUS
  Filled 2016-06-24: qty 100

## 2016-06-24 MED ORDER — SODIUM CHLORIDE 0.9 % IV BOLUS (SEPSIS)
1000.0000 mL | Freq: Once | INTRAVENOUS | Status: AC
  Administered 2016-06-24: 1000 mL via INTRAVENOUS

## 2016-06-24 NOTE — ED Provider Notes (Signed)
[facility] Emergency Department Provider Note  ____________________________________________   First MD Initiated Contact with Patient 06/24/16 1243     (approximate)  I have reviewed the triage vital signs and the nursing notes.   HISTORY  Chief Complaint Abdominal Pain   HPI Katrina Murray is a 46 y.o. female who presents to the emergency department for evaluation of upper abdominal pain that has been present for approximately one month. She's been evaluated here as well as at her primary care providers office and has not had a confirmed diagnosis for the source of pain. Today, the pain is epigastric and radiates over into the right upper quadrant. She is very nauseated with certain smells and eating. She's had no known fever.   Past Medical History:  Diagnosis Date  . Hypercholesteremia   . Hypertension   . Multiple sclerosis (HCC)     There are no active problems to display for this patient.   Past Surgical History:  Procedure Laterality Date  . ABDOMINAL HYSTERECTOMY    . PARTIAL HYSTERECTOMY    . TUBAL LIGATION      Prior to Admission medications   Medication Sig Start Date End Date Taking? Authorizing Provider  glatiramer (COPAXONE) 20 MG/ML SOSY injection Inject 20 mg into the skin 3 (three) times a week.    [provider]  hydrochlorothiazide (HYDRODIURIL) 25 MG tablet Take 25 mg by mouth daily.    [provider]  Hyoscyamine Sulfate SL (LEVSIN/SL) 0.125 MG SUBL Place 0.125 tablets under the tongue every 4 (four) hours as needed. 06/24/16   Adalynd Donahoe B, FNP  levothyroxine (SYNTHROID, LEVOTHROID) 25 MCG tablet Take by mouth. 03/31/15   [provider]  lisinopril (PRINIVIL,ZESTRIL) 40 MG tablet Take by mouth. 04/22/15   [provider]  omeprazole (PRILOSEC) 20 MG capsule Take by mouth. 03/31/15   [provider]  simvastatin (ZOCOR) 40 MG tablet Take by mouth. 03/31/15   [provider]  solifenacin (VESICARE) 5 MG tablet Take 1 tablet (5 mg total) by mouth daily. 03/27/16   Alfredo Martinez, MD  topiramate (TOPAMAX) 25 MG tablet Take by mouth. 04/22/15   [provider]    Allergies Patient has no known allergies.  Family History  Problem Relation Age of Onset  . Breast cancer Maternal Aunt   . Bladder Cancer Neg Hx   . Kidney cancer Neg Hx     Social History Social History  Substance Use Topics  . Smoking status: Former Games developer  . Smokeless tobacco: Never Used  . Alcohol use No     Comment: occasional    Review of Systems  Constitutional: No fever/chills Eyes: No visual changes. ENT: No sore throat. Cardiovascular: Denies chest pain. Respiratory: Denies shortness of breath. Gastrointestinal: Positive for abdominal pain, positive for nausea, negative for vomiting, negative for diarrhea. Genitourinary: Negative for dysuria. Musculoskeletal: Negative for back pain. Skin: Negative for rash. Neurological: Negative for headaches, focal weakness or numbness. ____________________________________________   PHYSICAL EXAM:  VITAL SIGNS: ED Triage Vitals  Enc Vitals Group     BP 06/24/16 1109 125/82     Pulse Rate 06/24/16 1109 68     Resp 06/24/16 1109 18     Temp 06/24/16 1109 98 F (36.7 C)     Temp Source 06/24/16 1109 Oral     SpO2 06/24/16 1109 96 %     Weight 06/24/16 1119 274 lb (124.3 kg)     Height 06/24/16 1119 5\' 9"  (  1.753 m)     Head Circumference --      Peak Flow --      Pain Score 06/24/16 1118 10     Pain Loc --      Pain Edu? --      Excl. in GC? --     Constitutional: Alert and oriented. Uncomfortable appearing and in no acute distress. Eyes: Conjunctivae are normal.  EOMI. Head: Atraumatic. Nose: No congestion/rhinnorhea. Mouth/Throat: Mucous membranes are moist.  Cardiovascular: Normal rate, regular rhythm. Grossly normal heart sounds.  Good peripheral circulation. Respiratory: Normal respiratory  effort.  No retractions. Lungs CTAB. Gastrointestinal: Soft with tenderness in the epigastrium and right upper quadrant. No distention. No abdominal bruits. No CVA tenderness. Musculoskeletal: No lower extremity tenderness nor edema.  No joint effusions. Neurologic:  Normal speech and language. No gross focal neurologic deficits are appreciated. No gait instability. Skin:  Skin is warm, dry and intact. No rash noted. Psychiatric: Mood and affect are normal. Speech and behavior are normal.  ____________________________________________   LABS (all labs ordered are listed, but only abnormal results are displayed)  Labs Reviewed  COMPREHENSIVE METABOLIC PANEL - Abnormal; Notable for the following:       Result Value   CO2 21 (*)    Creatinine, Ser 1.22 (*)    GFR calc non Af Amer 52 (*)    All other components within normal limits  URINALYSIS, COMPLETE (UACMP) WITH MICROSCOPIC - Abnormal; Notable for the following:    Color, Urine YELLOW (*)    APPearance CLEAR (*)    Hgb urine dipstick MODERATE (*)    Squamous Epithelial / LPF 0-5 (*)    All other components within normal limits  LIPASE, BLOOD  CBC   ____________________________________________  EKG  Not indicated ____________________________________________  RADIOLOGY  Ultrasound of the right upper quadrant does not indicate cholelithiasis or cholecystitis. CT of the abdomen and pelvis with contrast shows no acute findings per radiology. ____________________________________________   PROCEDURES  Procedure(s) performed: None  Procedures  Critical Care performed: No  ____________________________________________   INITIAL IMPRESSION / ASSESSMENT AND PLAN / ED COURSE  Pertinent labs & imaging results that were available during my care of the patient were reviewed by me and considered in my medical decision making (see chart for details).  46 year old female presenting to the emergency department for evaluation of  abdominal pain. An ultrasound of the left upper quadrant previously performed as ordered by her primary care provider per patient report. She has been given prescriptions for tramadol and Zofran. She states that the tramadol helped some and the Zofran only made her feel more nauseated. Symptoms and exam consistent with acute cholecystitis. ----------------------------------------- 2:12 PM on 06/24/2016 -----------------------------------------  Results of ultrasound were discussed with the patient. We will continue with an abdominal and pelvic CT scan with contrast. Patient reports pain is well-controlled at this time.    3:30 PM: Results of the CT discussed with patient and family. She will be discharged home and was strongly advised to call and schedule for a primary care provider. We discussed scheduling an outpatient HIDA scan. She will receive a prescription for Levsin and be advised to return to the emergency department for symptoms that change or worsen if she is unable to schedule an appointment with primary care or get an outpatient HIDA scan.     ____________________________________________   FINAL CLINICAL IMPRESSION(S) / ED DIAGNOSES  Final diagnoses:  Acute epigastric pain  Pain of upper abdomen  NEW MEDICATIONS STARTED DURING THIS VISIT:  Discharge Medication List as of 06/24/2016  3:42 PM    START taking these medications   Details  Hyoscyamine Sulfate SL (LEVSIN/SL) 0.125 MG SUBL Place 0.125 tablets under the tongue every 4 (four) hours as needed., Starting Mon 06/24/2016, Print         Note:  This document was prepared using Dragon voice recognition software and may include unintentional dictation errors.    Chinita Pester, FNP 06/24/16 1657    Jene Every, MD 06/26/16 1323

## 2016-06-24 NOTE — Discharge Instructions (Signed)
Please call and schedule a follow up with your primary care provider. I recommend scheduling a HIDA scan. Please discuss this when you go. If the pain worsens or your symptoms change, return to the emergency department.

## 2016-06-24 NOTE — ED Triage Notes (Signed)
Pt reports having intermittent RUQ and for the past month. PT was seen in ED and by PCP with US performed and has not had a diagnosis for pain. Pt reports increased nausea when smelling food. No nausea when eating. No changes in BM  or urine. No fevers reported.

## 2016-06-24 NOTE — ED Notes (Signed)
See triage note  States is having upper abd pain for about 1 month  Has been seen several times for same  But pain conts   Denies any n/v/d  Pt is very tearful

## 2016-07-11 ENCOUNTER — Encounter: Payer: Self-pay | Admitting: *Deleted

## 2016-07-11 ENCOUNTER — Other Ambulatory Visit: Payer: Self-pay | Admitting: Gastroenterology

## 2016-07-11 DIAGNOSIS — R1013 Epigastric pain: Secondary | ICD-10-CM

## 2016-07-12 ENCOUNTER — Ambulatory Visit: Payer: Medicaid Other | Admitting: Anesthesiology

## 2016-07-12 ENCOUNTER — Encounter
Admission: RE | Disposition: A | Discharge status: Home / Self Care | Payer: Self-pay | Source: Ambulatory Visit | Attending: Gastroenterology

## 2016-07-12 ENCOUNTER — Ambulatory Visit: Payer: Medicaid Other

## 2016-07-12 ENCOUNTER — Ambulatory Visit
Admission: RE | Admit: 2016-07-12 | Discharge: 2016-07-12 | Disposition: A | Discharge status: Home / Self Care | Payer: Medicaid Other | Source: Ambulatory Visit | Attending: Gastroenterology | Admitting: Gastroenterology

## 2016-07-12 ENCOUNTER — Encounter: Payer: Self-pay | Admitting: *Deleted

## 2016-07-12 DIAGNOSIS — E7800 Pure hypercholesterolemia, unspecified: Secondary | ICD-10-CM | POA: Insufficient documentation

## 2016-07-12 DIAGNOSIS — I10 Essential (primary) hypertension: Secondary | ICD-10-CM | POA: Diagnosis not present

## 2016-07-12 DIAGNOSIS — K2950 Unspecified chronic gastritis without bleeding: Secondary | ICD-10-CM | POA: Insufficient documentation

## 2016-07-12 DIAGNOSIS — G35 Multiple sclerosis: Secondary | ICD-10-CM | POA: Diagnosis not present

## 2016-07-12 DIAGNOSIS — G43909 Migraine, unspecified, not intractable, without status migrainosus: Secondary | ICD-10-CM | POA: Diagnosis not present

## 2016-07-12 DIAGNOSIS — Z87891 Personal history of nicotine dependence: Secondary | ICD-10-CM | POA: Insufficient documentation

## 2016-07-12 DIAGNOSIS — Z79899 Other long term (current) drug therapy: Secondary | ICD-10-CM | POA: Insufficient documentation

## 2016-07-12 DIAGNOSIS — K219 Gastro-esophageal reflux disease without esophagitis: Secondary | ICD-10-CM | POA: Insufficient documentation

## 2016-07-12 DIAGNOSIS — R1013 Epigastric pain: Secondary | ICD-10-CM | POA: Diagnosis present

## 2016-07-12 DIAGNOSIS — K2210 Ulcer of esophagus without bleeding: Secondary | ICD-10-CM | POA: Diagnosis not present

## 2016-07-12 HISTORY — PX: ESOPHAGOGASTRODUODENOSCOPY (EGD) WITH PROPOFOL: SHX5813

## 2016-07-12 HISTORY — DX: Gastro-esophageal reflux disease without esophagitis: K21.9

## 2016-07-12 SURGERY — ESOPHAGOGASTRODUODENOSCOPY (EGD) WITH PROPOFOL
Anesthesia: General

## 2016-07-12 MED ORDER — MIDAZOLAM HCL 2 MG/2ML IJ SOLN
INTRAMUSCULAR | Status: AC
  Filled 2016-07-12: qty 2

## 2016-07-12 MED ORDER — PROPOFOL 500 MG/50ML IV EMUL
INTRAVENOUS | Status: AC
  Filled 2016-07-12: qty 50

## 2016-07-12 MED ORDER — GLYCOPYRROLATE 0.2 MG/ML IJ SOLN
INTRAMUSCULAR | Status: DC | PRN
Start: 2016-07-12 — End: 2016-07-12
  Administered 2016-07-12 (×2): 0.2 mg via INTRAVENOUS

## 2016-07-12 MED ORDER — MIDAZOLAM HCL 2 MG/2ML IJ SOLN
INTRAMUSCULAR | Status: DC | PRN
  Administered 2016-07-12: 2 mg via INTRAVENOUS

## 2016-07-12 MED ORDER — PROPOFOL 500 MG/50ML IV EMUL
INTRAVENOUS | Status: DC | PRN
  Administered 2016-07-12: 200 ug/kg/min via INTRAVENOUS

## 2016-07-12 MED ORDER — SODIUM CHLORIDE 0.9 % IV SOLN: INTRAVENOUS | Status: DC

## 2016-07-12 MED ORDER — PROPOFOL 10 MG/ML IV BOLUS
INTRAVENOUS | Status: DC | PRN
  Administered 2016-07-12: 60 mg via INTRAVENOUS

## 2016-07-12 MED ORDER — LIDOCAINE 2% (20 MG/ML) 5 ML SYRINGE
INTRAMUSCULAR | Status: DC | PRN
  Administered 2016-07-12 (×2): 100 mg via INTRAVENOUS

## 2016-07-12 MED ORDER — SODIUM CHLORIDE 0.9 % IV SOLN
INTRAVENOUS | Status: DC
  Administered 2016-07-12: 1000 mL via INTRAVENOUS

## 2016-07-12 NOTE — Anesthesia Post-op Follow-up Note (Cosign Needed)
Anesthesia QCDR form completed.        

## 2016-07-12 NOTE — Anesthesia Preprocedure Evaluation (Signed)
Anesthesia Evaluation  Patient identified by MRN, date of birth, ID band Patient awake    Reviewed: Allergy & Precautions, NPO status , Patient's Chart, lab work & pertinent test results  History of Anesthesia Complications Negative for: history of anesthetic complications  Airway Mallampati: II       Dental   Pulmonary former smoker,           Cardiovascular hypertension, Pt. on medications      Neuro/Psych negative neurological ROS     GI/Hepatic Neg liver ROS, GERD  Medicated and Poorly Controlled,  Endo/Other  negative endocrine ROS  Renal/GU negative Renal ROS     Musculoskeletal   Abdominal   Peds  Hematology negative hematology ROS (+)   Anesthesia Other Findings   Reproductive/Obstetrics                             Anesthesia Physical Anesthesia Plan  ASA: III  Anesthesia Plan: General   Post-op Pain Management:    Induction: Intravenous  Airway Management Planned: Nasal Cannula  Additional Equipment:   Intra-op Plan:   Post-operative Plan:   Informed Consent: I have reviewed the patients History and Physical, chart, labs and discussed the procedure including the risks, benefits and alternatives for the proposed anesthesia with the patient or authorized representative who has indicated his/her understanding and acceptance.     Plan Discussed with:   Anesthesia Plan Comments:         Anesthesia Quick Evaluation

## 2016-07-12 NOTE — H&P (Signed)
Outpatient short stay form Pre-procedure 07/12/2016 4:21 PM Christena Deem MD  Primary Physician: [facility]  Reason for visit:  EGD  History of present illness:  Patient is a 46 year old female presenting today as above. As a history of having epigastric pain and left upper quadrant pain for at least a year. During that interval she has been taking multiple NSAIDs weekly to include BC powders on ibuprofen Aleve etc. She has been taking some omeprazole once a day until she was seen in the office yesterday when she was changed to Protonix twice a day. She was seen in the emergency department at [facility] on 06/24/2016 for upper abdominal pain and although acute cholecystitis was expected there is no evidence of gallstones on ultrasound. CT scan showed some liver cysts and hepatic steatosis. She has apparently not had an upper scope in the past. She has noted some black stools intermittently. Most recent perhaps within the past 3 days. She does have a history of multiple sclerosis although symptoms have not involved her vision, respiratory or bowels. She does take these NSAID medications frequently for problems with migraine headache.    Current Facility-Administered Medications:  .  0.9 %  sodium chloride infusion, , Intravenous, Continuous, Christena Deem, MD, Last Rate: 20 mL/hr at 07/12/16 1501, 1,000 mL at 07/12/16 1501 .  0.9 %  sodium chloride infusion, , Intravenous, Continuous, Christena Deem, MD  Prescriptions Prior to Admission  Medication Sig Dispense Refill Last Dose  . glatiramer (COPAXONE) 20 MG/ML SOSY injection Inject 20 mg into the skin 3 (three) times a week.   07/12/2016 at 1400  . Hyoscyamine Sulfate SL (LEVSIN/SL) 0.125 MG SUBL Place 0.125 tablets under the tongue every 4 (four) hours as needed. 120 each 0 07/12/2016 at Unknown time  . levothyroxine (SYNTHROID, LEVOTHROID) 25 MCG tablet Take by mouth.   07/11/2016 at Unknown time  .  lisinopril (PRINIVIL,ZESTRIL) 40 MG tablet Take by mouth.   07/11/2016 at Unknown time  . simvastatin (ZOCOR) 40 MG tablet Take by mouth.   07/11/2016 at Unknown time  . solifenacin (VESICARE) 5 MG tablet Take 1 tablet (5 mg total) by mouth daily. 30 tablet 11 07/11/2016 at Unknown time  . topiramate (TOPAMAX) 25 MG tablet Take by mouth.   07/11/2016 at Unknown time  . hydrochlorothiazide (HYDRODIURIL) 25 MG tablet Take 25 mg by mouth daily.   Not Taking at Unknown time  . omeprazole (PRILOSEC) 20 MG capsule Take by mouth.   Not Taking at Unknown time     No Known Allergies   Past Medical History:  Diagnosis Date  . GERD (gastroesophageal reflux disease)   . Hypercholesteremia   . Hypertension   . Multiple sclerosis (HCC)     Review of systems:      Physical Exam    Heart and lungs: Regular rate and rhythm without rub or gallop, lungs are bilaterally clear.    HEENT: Normocephalic atraumatic eyes are anicteric    Other:     Pertinant exam for procedure: Soft, obese, markedly tender to palpation left upper quadrant extending toward the epigastric midline. There is mild discomfort in the right upper quadrant. There are no masses or rebound. Bowel sounds are positive.    Planned proceedures: EGD and indicated procedures. I have discussed the risks benefits and complications of procedures to include not limited to bleeding, infection, perforation and the risk of sedation and the patient wishes to proceed.    Cindra Eves  Marva Panda, MD Gastroenterology 07/12/2016  4:21 PM

## 2016-07-12 NOTE — Anesthesia Postprocedure Evaluation (Signed)
Anesthesia Post Note  Patient: PEARLENE HICKMOTT  Procedure(s) Performed: Procedure(s) (LRB): ESOPHAGOGASTRODUODENOSCOPY (EGD) WITH PROPOFOL (N/A)  Patient location during evaluation: Endoscopy Anesthesia Type: General Level of consciousness: awake and alert Pain management: pain level controlled Vital Signs Assessment: post-procedure vital signs reviewed and stable Respiratory status: spontaneous breathing and respiratory function stable Cardiovascular status: stable Anesthetic complications: no     Last Vitals:  Vitals:   07/12/16 1747 07/12/16 1757  BP: (!) 146/101 (!) 135/108  Pulse: (!) 112 100  Resp: 14 19  Temp:      Last Pain:  Vitals:   07/12/16 1727  TempSrc: Tympanic  PainSc: Asleep                 KEPHART,WILLIAM K

## 2016-07-12 NOTE — Op Note (Signed)
[facility] Gastroenterology Patient Name: Katrina Murray Procedure Date: 07/12/2016 4:23 PM MRN: [id number] Account #: [id number] Date of Birth: 1970-09-11 Admit Type: Outpatient Age: 46 Room: [facility] ENDO ROOM 3 Gender: Female Note Status: Finalized Procedure:            Upper GI endoscopy Indications:          Epigastric abdominal pain, Dyspepsia Providers:            Christena Deem, MD Referring MD:         Alvina Filbert (Referring MD) Medicines:            Monitored Anesthesia Care Complications:        No immediate complications. Procedure:            Pre-Anesthesia Assessment:                       - ASA Grade Assessment: III - A patient with severe                        systemic disease.                       After obtaining informed consent, the endoscope was                        passed under direct vision. Throughout the procedure,                        the patient's blood pressure, pulse, and oxygen                        saturations were monitored continuously. The Endoscope                        was introduced through the mouth, and advanced to the                        third part of duodenum. The upper GI endoscopy was                        accomplished without difficulty. The patient tolerated                        the procedure. Findings:      LA Grade B (one or more mucosal breaks greater than 5 mm, not extending       between the tops of two mucosal folds) esophagitis with no bleeding was       found.      Patchy mild inflammation characterized by congestion (edema), erosions       and erythema was found at the incisura and in the gastric antrum.      The cardia and gastric fundus were normal on retroflexion.      The examined duodenum was normal.      no evidence of active or stigmata or recent bleeding. Biopsies were       taken from the antrum and gastric body with a cold forceps for histology.      The exam of the esophagus was  otherwise normal. Impression:           - LA Grade B erosive esophagitis.                       -  Erosive gastritis.                       - Normal examined duodenum. Recommendation:       - Use sucralfate tablets 1 gram PO QID for 4 weeks. Procedure Code(s):    --- Professional ---                       [phone], Esophagogastroduodenoscopy, flexible, transoral;                        with biopsy, single or multiple Diagnosis Code(s):    --- Professional ---                       K20.8, Other esophagitis                       K29.60, Other gastritis without bleeding                       R10.13, Epigastric pain CPT copyright 2016 American Medical Association. All rights reserved. The codes documented in this report are preliminary and upon coder review may  be revised to meet current compliance requirements. Christena Deem, MD 07/12/2016 5:28:06 PM This report has been signed electronically. Number of Addenda: 0 Note Initiated On: 07/12/2016 4:23 PM      [facility]

## 2016-07-12 NOTE — Transfer of Care (Signed)
Immediate Anesthesia Transfer of Care Note  Patient: [name]  Procedure(s) Performed: Procedure(s): ESOPHAGOGASTRODUODENOSCOPY (EGD) WITH PROPOFOL (N/A)  Patient Location: Endoscopy Unit  Anesthesia Type:General  Level of Consciousness: sedated  Airway & Oxygen Therapy: Patient Spontanous Breathing and Patient connected to nasal cannula oxygen  Post-op Assessment: Post -op Vital signs reviewed and stable  Post vital signs: stable  Last Vitals:  Vitals:   07/12/16 1450 07/12/16 1727  BP: (!) 150/123 113/78  Pulse: 80 (!) 110  Resp: 16 17  Temp: 36.5 C 36.2 C    Last Pain:  Vitals:   07/12/16 1727  TempSrc: Tympanic  PainSc:       Patients Stated Pain Goal: 0 (07/12/16 1450)  Complications: No apparent anesthesia complications

## 2016-07-15 ENCOUNTER — Encounter: Payer: Self-pay | Admitting: Gastroenterology

## 2016-07-16 ENCOUNTER — Ambulatory Visit
Admission: RE | Admit: 2016-07-16 | Discharge: 2016-07-16 | Disposition: A | Discharge status: Home / Self Care | Payer: Medicaid Other | Source: Ambulatory Visit | Attending: Gastroenterology | Admitting: Gastroenterology

## 2016-07-16 DIAGNOSIS — R1013 Epigastric pain: Secondary | ICD-10-CM | POA: Diagnosis not present

## 2016-07-16 LAB — SURGICAL PATHOLOGY

## 2016-07-16 IMAGING — NM NM HEPATO W/GB/PHARM/[PERSON_NAME]
2 series · 12 of 12 positions shown · non-contrast
Comparison: CT [DATE].

CLINICAL DATA: Epigastric pain.  Nausea.

EXAM:
NUCLEAR MEDICINE HEPATOBILIARY IMAGING WITH GALLBLADDER EF
TECHNIQUE: Sequential images of the abdomen were obtained [DATE] minutes
following intravenous administration of radiopharmaceutical. After
oral ingestion of Ensure, gallbladder ejection fraction was
determined. At 60 min, normal ejection fraction is greater than 33%.
RADIOPHARMACEUTICALS:  5.3 mCi [FC]  Choletec IV

[Series 1000: gallbladder ef · 4.80mm/px · 6 of 120 frames shown]
[frame 11/120]
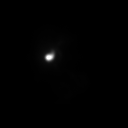
[frame 31/120]
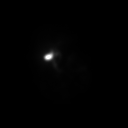
[frame 51/120]
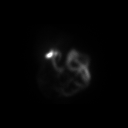
[frame 71/120]
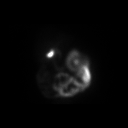
[frame 91/120]
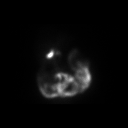
[frame 111/120]
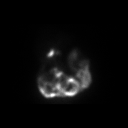

[Series 1000: hepatobiliary scan · 9.59mm/px · 6 of 60 frames shown]
[frame 6/60]
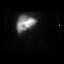
[frame 16/60]
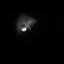
[frame 26/60]
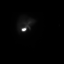
[frame 36/60]
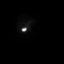
[frame 46/60]
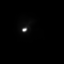
[frame 56/60]
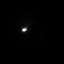

[12 of 12 positions shown; findings below may reference images not displayed]

FINDINGS: Prompt uptake and biliary excretion of activity by the liver is
seen. Filling defects in the liver consistent with known cysts.
Gallbladder activity is visualized, consistent with patency of
cystic duct. Biliary activity passes into small bowel, consistent
with patent common bile duct.

Calculated gallbladder ejection fraction is 91%. (Normal gallbladder
ejection fraction with Ensure is greater than 33%.)
IMPRESSION: No significant abnormalities identified. Normal gallbladder ejection
fraction .

## 2016-07-16 MED ORDER — TECHNETIUM TC 99M MEBROFENIN IV KIT
5.0000 | PACK | Freq: Once | INTRAVENOUS | Status: AC | PRN
  Administered 2016-07-16: 5.28 via INTRAVENOUS

## 2016-07-22 ENCOUNTER — Encounter: Payer: Self-pay | Admitting: Urology

## 2016-07-22 ENCOUNTER — Ambulatory Visit (INDEPENDENT_AMBULATORY_CARE_PROVIDER_SITE_OTHER): Payer: Medicaid Other | Admitting: Urology

## 2016-07-22 VITALS — BP 147/94 | HR 70 | Ht 69.0 in | Wt 274.7 lb

## 2016-07-22 DIAGNOSIS — N3946 Mixed incontinence: Secondary | ICD-10-CM

## 2016-07-22 NOTE — Progress Notes (Signed)
07/22/2016 10:53 AM   Katrina Murray 10-24-1970 [id number]  Referring provider: Alvina Filbert, MD [address] [place], [place] [address]  Chief Complaint  Patient presents with  . Follow-up    6 week Stress and Mixed Incontinence    HPI: I dictated a long note 03/27/2016 Currently she leaks with coughing sneezing and sometimes bending and lifting. She has urge incontinence. She believes a stress incontinence is worse. She does have mild to moderate bedwetting. She wears 3 pads a day sometimes almost moderately wet.  She voids 4-5 times a day with no ankle edema but drinks a lot of fluids. She voids every 2 hours and has a reasonable flow and does feel empty  She was diagnosed with multiple sclerosis in 2003 and does well with that.   During urodynamics the patient's bladder capacity was 600 mL and the bladder was stable. Her cough leak point pressure at 200 mL was 47 cm water with very mild leakage. During voluntary voiding she was generating contractions up to 26 cm water that were not well sustained and she was not able to generate a flow. EMG activity was normal. Bladder neck descended 1 cm. The patient noted to Steward Drone that sometimes she has to wait longer and get a strong urge before she is able to void. The voiding phase was quite impressive with one contraction after another but she was unable to void. Fluoroscopically the bladder looked normal I'm not convinced that she had fluoroscopic evidence of external sphincter dyssynergia but there was some suggestion of it. The bladder neck was open with abrupt cut off near the mid to distal urethra  The patient has a mild outlet abnormality and by history has urge incontinence and bedwetting and likely has a neurogenic bladder. 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 She does have a low pressure bladder. In my opinion she should not have a sling and  would be at very high risk of long-term urinary retention. I urethral injectable would be a better option with the rare risk of her retention.  Today When I saw her last time her frequency and especially nighttime frequency was improved and she was leaking last. She was more than 40% better on 5 mg of Vesicare. The patient states much drier during the day. She still gets up 3 times a night but can drink 8-10 20 ounce bottles of water a day. She denies ankle edema.    PMH: Past Medical History:  Diagnosis Date  . GERD (gastroesophageal reflux disease)   . Hypercholesteremia   . Hypertension   . Multiple sclerosis [facility])     Surgical History: Past Surgical History:  Procedure Laterality Date  . ABDOMINAL HYSTERECTOMY    . ESOPHAGOGASTRODUODENOSCOPY (EGD) WITH PROPOFOL N/A 07/12/2016   Procedure: ESOPHAGOGASTRODUODENOSCOPY (EGD) WITH PROPOFOL;  Surgeon: Christena Deem, MD;  Location[facility] ENDOSCOPY;  Service: Endoscopy;  Laterality: N/A;  . PARTIAL HYSTERECTOMY    . TUBAL LIGATION      Home Medications:  Allergies as of 07/22/2016   No Known Allergies     Medication List       Accurate as of 07/22/16 10:53 AM. Always use your most recent med list.          glatiramer 20 MG/ML Sosy injection Commonly known as:  COPAXONE Inject 20 mg into the skin 3 (three) times a week.   hydrochlorothiazide 25 MG tablet Commonly known as:  HYDRODIURIL Take 25 mg by mouth daily.   Hyoscyamine Sulfate SL 0.125 MG Subl Commonly known as:  LEVSIN/SL Place 0.125 tablets under the tongue every 4 (four) hours as needed.   levothyroxine 25 MCG tablet Commonly known as:  SYNTHROID, LEVOTHROID Take by mouth.   lisinopril 40 MG tablet Commonly known as:  PRINIVIL,ZESTRIL Take by mouth.   omeprazole 20 MG capsule Commonly known as:  PRILOSEC Take by mouth.   pantoprazole 40 MG tablet Commonly known as:  PROTONIX Take by mouth.   simvastatin 40 MG tablet Commonly known as:   ZOCOR Take by mouth.   solifenacin 5 MG tablet Commonly known as:  VESICARE Take 1 tablet (5 mg total) by mouth daily.   topiramate 25 MG tablet Commonly known as:  TOPAMAX Take by mouth.       Allergies: No Known Allergies  Family History: Family History  Problem Relation Age of Onset  . Breast cancer Maternal Aunt   . Bladder Cancer Neg Hx   . Kidney cancer Neg Hx     Social History:  reports that she quit smoking about 10 years ago. She has never used smokeless tobacco. She reports that she does not drink alcohol or use drugs.  ROS: UROLOGY Frequent Urination?: No Hard to postpone urination?: No Burning/pain with urination?: No Get up at night to urinate?: Yes Leakage of urine?: Yes Urine stream starts and stops?: No Trouble starting stream?: No Do you have to strain to urinate?: No Blood in urine?: No Urinary tract infection?: No Sexually transmitted disease?: No Injury to kidneys or bladder?: No Painful intercourse?: Yes Weak stream?: No Currently pregnant?: No Vaginal bleeding?: No Last menstrual period?: n  Gastrointestinal Nausea?: Yes Vomiting?: No Indigestion/heartburn?: No Diarrhea?: No Constipation?: No  Constitutional Fever: No Night sweats?: No Weight loss?: No Fatigue?: No  Skin Skin rash/lesions?: No Itching?: No  Eyes Blurred vision?: Yes Double vision?: No  Ears/Nose/Throat Sore throat?: No Sinus problems?: No  Hematologic/Lymphatic Swollen glands?: No Easy bruising?: Yes  Cardiovascular Leg swelling?: No Chest pain?: No  Respiratory Cough?: No Shortness of breath?: No  Endocrine Excessive thirst?: No  Musculoskeletal Back pain?: No Joint pain?: Yes  Neurological Headaches?: No Dizziness?: No  Psychologic Depression?: No Anxiety?: No  Physical Exam: BP (!) 147/94   Pulse 70   Ht 5\' 9"  (1.753 m)   Wt 274 lb 11.2 oz (124.6 kg)   BMI 40.57 kg/m   Constitutional:  Alert and oriented, No acute  distress.  Laboratory Data: Lab Results  Component Value Date   WBC 10.3 06/24/2016   HGB 14.6 06/24/2016   HCT 42.3 06/24/2016   MCV 82.7 06/24/2016   PLT 298 06/24/2016    Lab Results  Component Value Date   CREATININE 1.22 (H) 06/24/2016    No results found for: PSA  No results found for: TESTOSTERONE  No results found for: HGBA1C  Urinalysis    Component Value Date/Time   COLORURINE YELLOW (A) 06/24/2016 1117   APPEARANCEUR CLEAR (A) 06/24/2016 1117   APPEARANCEUR Hazy (A) 02/26/2016 1020   LABSPEC 1.015 06/24/2016 1117   PHURINE 5.0 06/24/2016 1117   GLUCOSEU NEGATIVE 06/24/2016 1117   HGBUR MODERATE (A) 06/24/2016 1117   BILIRUBINUR NEGATIVE 06/24/2016 1117   BILIRUBINUR Negative 02/26/2016 1020   KETONESUR NEGATIVE 06/24/2016 1117   PROTEINUR NEGATIVE 06/24/2016 1117   NITRITE NEGATIVE 06/24/2016 1117   LEUKOCYTESUR NEGATIVE 06/24/2016 1117   LEUKOCYTESUR Negative 02/26/2016 1020    Pertinent Imaging: none  Assessment & Plan:  Overall improved frequency nocturia and urinary incontinence. She understands she should not have a sling for her mild stress incontinence with multiple sclerosis. Prescription of Vesicare renewed and I will see her in 1 year  There are no diagnoses linked to this encounter.  Return in about 1 year (around 07/22/2017).  Martina Sinner, MD  [facility] [address], [address] [place], [place] [address] [phone]

## 2016-07-22 NOTE — Progress Notes (Signed)
Dr. Javier Docker 1 year refill rx sent for vesicare 5 mg patient states she has a current rx with 10 refills left. I let her know that she can just call the pharmacy about one month before she runs out for refills. Patient ok with the plan.

## 2016-09-23 ENCOUNTER — Other Ambulatory Visit: Payer: Self-pay | Admitting: Internal Medicine

## 2016-09-23 DIAGNOSIS — Z1231 Encounter for screening mammogram for malignant neoplasm of breast: Secondary | ICD-10-CM

## 2016-10-07 ENCOUNTER — Ambulatory Visit (INDEPENDENT_AMBULATORY_CARE_PROVIDER_SITE_OTHER): Payer: Medicaid Other | Admitting: Otolaryngology

## 2016-10-16 ENCOUNTER — Ambulatory Visit
Admission: RE | Admit: 2016-10-16 | Discharge: 2016-10-16 | Disposition: A | Discharge status: Home / Self Care | Payer: Medicaid Other | Source: Ambulatory Visit | Attending: Internal Medicine | Admitting: Internal Medicine

## 2016-10-16 DIAGNOSIS — Z1231 Encounter for screening mammogram for malignant neoplasm of breast: Secondary | ICD-10-CM | POA: Insufficient documentation

## 2017-01-29 ENCOUNTER — Encounter: Payer: Self-pay | Admitting: *Deleted

## 2017-01-29 ENCOUNTER — Ambulatory Visit: Payer: Medicaid Other | Admitting: Registered Nurse

## 2017-01-29 ENCOUNTER — Encounter
Admission: RE | Disposition: A | Discharge status: Home / Self Care | Payer: Self-pay | Source: Ambulatory Visit | Attending: Internal Medicine

## 2017-01-29 ENCOUNTER — Ambulatory Visit
Admission: RE | Admit: 2017-01-29 | Discharge: 2017-01-29 | Disposition: A | Discharge status: Home / Self Care | Payer: Medicaid Other | Source: Ambulatory Visit | Attending: Internal Medicine | Admitting: Internal Medicine

## 2017-01-29 DIAGNOSIS — K219 Gastro-esophageal reflux disease without esophagitis: Secondary | ICD-10-CM | POA: Diagnosis not present

## 2017-01-29 DIAGNOSIS — Z1211 Encounter for screening for malignant neoplasm of colon: Secondary | ICD-10-CM | POA: Diagnosis not present

## 2017-01-29 DIAGNOSIS — Z8371 Family history of colonic polyps: Secondary | ICD-10-CM | POA: Insufficient documentation

## 2017-01-29 DIAGNOSIS — G35 Multiple sclerosis: Secondary | ICD-10-CM | POA: Insufficient documentation

## 2017-01-29 DIAGNOSIS — Z6841 Body Mass Index (BMI) 40.0 and over, adult: Secondary | ICD-10-CM | POA: Diagnosis not present

## 2017-01-29 DIAGNOSIS — E785 Hyperlipidemia, unspecified: Secondary | ICD-10-CM | POA: Diagnosis not present

## 2017-01-29 DIAGNOSIS — Z87891 Personal history of nicotine dependence: Secondary | ICD-10-CM | POA: Diagnosis not present

## 2017-01-29 DIAGNOSIS — K640 First degree hemorrhoids: Secondary | ICD-10-CM | POA: Insufficient documentation

## 2017-01-29 DIAGNOSIS — I10 Essential (primary) hypertension: Secondary | ICD-10-CM | POA: Insufficient documentation

## 2017-01-29 DIAGNOSIS — Z79899 Other long term (current) drug therapy: Secondary | ICD-10-CM | POA: Diagnosis not present

## 2017-01-29 DIAGNOSIS — E039 Hypothyroidism, unspecified: Secondary | ICD-10-CM | POA: Insufficient documentation

## 2017-01-29 HISTORY — PX: COLONOSCOPY WITH PROPOFOL: SHX5780

## 2017-01-29 HISTORY — DX: Hypothyroidism, unspecified: E03.9

## 2017-01-29 HISTORY — DX: Hyperlipidemia, unspecified: E78.5

## 2017-01-29 HISTORY — DX: Unspecified chronic gastritis without bleeding: K29.50

## 2017-01-29 SURGERY — COLONOSCOPY WITH PROPOFOL
Anesthesia: General

## 2017-01-29 MED ORDER — PROPOFOL 500 MG/50ML IV EMUL
INTRAVENOUS | Status: AC
  Filled 2017-01-29: qty 50

## 2017-01-29 MED ORDER — MIDAZOLAM HCL 2 MG/2ML IJ SOLN
INTRAMUSCULAR | Status: AC
  Filled 2017-01-29: qty 2

## 2017-01-29 MED ORDER — PROPOFOL 500 MG/50ML IV EMUL
INTRAVENOUS | Status: DC | PRN
  Administered 2017-01-29: 140 ug/kg/min via INTRAVENOUS

## 2017-01-29 MED ORDER — SODIUM CHLORIDE 0.9 % IV SOLN
INTRAVENOUS | Status: DC
  Administered 2017-01-29: 1000 mL via INTRAVENOUS

## 2017-01-29 MED ORDER — PROPOFOL 10 MG/ML IV BOLUS
INTRAVENOUS | Status: DC | PRN
  Administered 2017-01-29: 70 mg via INTRAVENOUS

## 2017-01-29 MED ORDER — LIDOCAINE HCL (CARDIAC) 20 MG/ML IV SOLN
INTRAVENOUS | Status: DC | PRN
  Administered 2017-01-29: 40 mg via INTRAVENOUS

## 2017-01-29 MED ORDER — MIDAZOLAM HCL 2 MG/2ML IJ SOLN
INTRAMUSCULAR | Status: DC | PRN
  Administered 2017-01-29 (×2): 1 mg via INTRAVENOUS

## 2017-01-29 NOTE — Interval H&P Note (Signed)
History and Physical Interval Note:  01/29/2017 8:20 AM  Katrina Murray  has presented today for surgery, with the diagnosis of EPIGASTRIC PAIN  The various methods of treatment have been discussed with the patient and family. After consideration of risks, benefits and other options for treatment, the patient has consented to  Procedure(s): COLONOSCOPY WITH PROPOFOL (N/A) as a surgical intervention .  The patient's history has been reviewed, patient examined, no change in status, stable for surgery.  I have reviewed the patient's chart and labs.  Questions were answered to the patient's satisfaction.     [place], [place]

## 2017-01-29 NOTE — Transfer of Care (Signed)
Immediate Anesthesia Transfer of Care Note  Patient: Katrina Murray  Procedure(s) Performed: COLONOSCOPY WITH PROPOFOL (N/A )  Patient Location: PACU  Anesthesia Type:General  Level of Consciousness: sedated  Airway & Oxygen Therapy: Patient Spontanous Breathing and Patient connected to nasal cannula oxygen  Post-op Assessment: Report given to RN and Post -op Vital signs reviewed and stable  Post vital signs: Reviewed and stable  Last Vitals:  Vitals:   01/29/17 0813 01/29/17 0856  BP: (!) 163/110 (!) 134/105  Pulse:  86  Resp:  (!) 22  Temp:  36.5 C  SpO2:  97%    Last Pain:  Vitals:   01/29/17 0856  TempSrc: Temporal         Complications: No apparent anesthesia complications

## 2017-01-29 NOTE — Anesthesia Post-op Follow-up Note (Signed)
Anesthesia QCDR form completed.        

## 2017-01-29 NOTE — Op Note (Signed)
[facility] Gastroenterology Patient Name: Katrina CitrinMelanie Murray Procedure Date: 01/29/2017 8:30 AM MRN: [id number] Account #: [id number] Date of Birth: [birth year] Admit Type: Outpatient Age: [age] Room: [facility] ENDO ROOM 4 Gender: Female Note Status: Finalized Procedure:            Colonoscopy Indications:          Colon cancer screening in patient at increased risk:                        Family history of colon polyps in multiple 1st-degree                        relatives Providers:            Boykin Nearingeodoro K. Norma Fredricksonoledo MD, MD Referring MD:         Alvina Filbertenise Hunter (Referring MD) Medicines:            Propofol per Anesthesia Complications:        No immediate complications. Procedure:            Pre-Anesthesia Assessment:                       - The risks and benefits of the procedure and the                        sedation options and risks were discussed with the                        patient. All questions were answered and informed                        consent was obtained.                       - Patient identification and proposed procedure were                        verified prior to the procedure by the nurse. The                        procedure was verified in the procedure room.                       - ASA Grade Assessment: II - A patient with mild                        systemic disease.                       - After reviewing the risks and benefits, the patient                        was deemed in satisfactory condition to undergo the                        procedure.                       After obtaining informed consent, the colonoscope was  passed under direct vision. Throughout the procedure,                        the patient's blood pressure, pulse, and oxygen                        saturations were monitored continuously. The                        Colonoscope was introduced through the anus and                        advanced to the  the cecum, identified by appendiceal                        orifice and ileocecal valve. The colonoscopy was                        performed without difficulty. The patient tolerated the                        procedure well. The quality of the bowel preparation                        was adequate. Findings:      The perianal and digital rectal examinations were normal. Pertinent       negatives include normal sphincter tone and no palpable rectal lesions.      The colon (entire examined portion) appeared normal.      Non-bleeding internal hemorrhoids were found during retroflexion. The       hemorrhoids were Grade I (internal hemorrhoids that do not prolapse).      The exam was otherwise without abnormality. Impression:           - The entire examined colon is normal.                       - Non-bleeding internal hemorrhoids.                       - The examination was otherwise normal.                       - No specimens collected. Recommendation:       - Patient has a contact number available for                        emergencies. The signs and symptoms of potential                        delayed complications were discussed with the patient.                        Return to normal activities tomorrow. Written discharge                        instructions were provided to the patient.                       - Resume previous diet.                       -  Continue present medications.                       - Repeat colonoscopy in 5 years for screening purposes. Procedure Code(s):    --- Professional ---                       W0981, Colorectal cancer screening; colonoscopy on                        individual at high risk Diagnosis Code(s):    --- Professional ---                       Z83.71, Family history of colonic polyps                       K64.0, First degree hemorrhoids CPT copyright 2016 American Medical Association. All rights reserved. The codes documented in this report  are preliminary and upon coder review may  be revised to meet current compliance requirements. Stanton Kidney MD, MD 01/29/2017 8:54:01 AM This report has been signed electronically. Number of Addenda: 0 Note Initiated On: 01/29/2017 8:30 AM Scope Withdrawal Time: 0 hours 7 minutes 31 seconds  Total Procedure Duration: 0 hours 12 minutes 44 seconds       [facility]

## 2017-01-29 NOTE — Anesthesia Postprocedure Evaluation (Signed)
Anesthesia Post Note  Patient: Katrina Murray  Procedure(s) Performed: COLONOSCOPY WITH PROPOFOL (N/A )  Patient location during evaluation: PACU Anesthesia Type: General Level of consciousness: awake Pain management: pain level controlled Vital Signs Assessment: post-procedure vital signs reviewed and stable Respiratory status: spontaneous breathing Cardiovascular status: stable Anesthetic complications: no     Last Vitals:  Vitals:   01/29/17 0855 01/29/17 0856  BP: (!) 134/105 (!) 134/105  Pulse: 86 86  Resp: (!) 21 (!) 22  Temp: (!) 36.2 C 36.5 C  SpO2: 97% 97%    Last Pain:  Vitals:   01/29/17 0856  TempSrc: Temporal  PainSc:                  VAN STAVEREN,[name]

## 2017-01-29 NOTE — H&P (Signed)
Outpatient short stay form Pre-procedure 01/29/2017 8:19 AM Teodoro K. Norma Fredricksonoledo, M.D.  Primary Physician: Alvina Filbertenise Hunter  Reason for visit:  Family hx of colon polyps  History of present illness: 46 y/o wf presents for colonoscopy for increased risk screening: Family hx of colon polyps. Patient denies abdominal pain, change in bowel habits or rectal bleeding.     Current Facility-Administered Medications:  .  0.9 %  sodium chloride infusion, , Intravenous, Continuous, Toledo, Boykin Nearingeodoro K, MD  Medications Prior to Admission  Medication Sig Dispense Refill Last Dose  . cyclobenzaprine (FLEXERIL) 10 MG tablet Take 10 mg by mouth 3 (three) times daily as needed for muscle spasms.     . sucralfate (CARAFATE) 1 g tablet Take 1 g by mouth 2 (two) times daily.     Marland Kitchen. glatiramer (COPAXONE) 20 MG/ML SOSY injection Inject 20 mg into the skin 3 (three) times a week.   Taking  . levothyroxine (SYNTHROID, LEVOTHROID) 25 MCG tablet Take by mouth.   Taking  . lisinopril (PRINIVIL,ZESTRIL) 40 MG tablet Take by mouth.   Taking  . pantoprazole (PROTONIX) 40 MG tablet Take by mouth.   Taking  . simvastatin (ZOCOR) 40 MG tablet Take by mouth.   Taking  . solifenacin (VESICARE) 5 MG tablet Take 1 tablet (5 mg total) by mouth daily. 30 tablet 11 Taking  . topiramate (TOPAMAX) 25 MG tablet Take by mouth.   Taking  . [DISCONTINUED] hydrochlorothiazide (HYDRODIURIL) 25 MG tablet Take 25 mg by mouth daily.   Not Taking  . [DISCONTINUED] Hyoscyamine Sulfate SL (LEVSIN/SL) 0.125 MG SUBL Place 0.125 tablets under the tongue every 4 (four) hours as needed. 120 each 0 Taking  . [DISCONTINUED] omeprazole (PRILOSEC) 20 MG capsule Take by mouth.   Not Taking     No Known Allergies   Past Medical History:  Diagnosis Date  . Gastritis, chronic   . GERD (gastroesophageal reflux disease)   . Hypercholesteremia   . Hyperlipidemia   . Hypertension   . Hypothyroidism   . Multiple sclerosis (HCC)     Review of systems:       Physical Exam  General appearance: alert, cooperative and appears stated age Resp: clear to auscultation bilaterally Cardio: regular rate and rhythm, S1, S2 normal, no murmur, click, rub or gallop GI: soft, non-tender; bowel sounds normal; no masses,  no organomegaly     Planned procedures: Colonoscopy. The patient understands the nature of the planned procedure, indications, risks, alternatives and potential complications including but not limited to bleeding, infection, perforation, damage to internal organs and possible oversedation/side effects from anesthesia. The patient agrees and gives consent to proceed.  Please refer to procedure notes for findings, recommendations and patient disposition/instructions.    Teodoro K. Norma Fredricksonoledo, M.D. Gastroenterology 01/29/2017  8:19 AM

## 2017-01-29 NOTE — Anesthesia Preprocedure Evaluation (Signed)
Anesthesia Evaluation  Patient identified by MRN, date of birth, ID band Patient awake    Reviewed: Allergy & Precautions, NPO status , Patient's Chart, lab work & pertinent test results  Airway Mallampati: II       Dental  (+) Teeth Intact   Pulmonary neg pulmonary ROS, former smoker,     + decreased breath sounds      Cardiovascular Exercise Tolerance: Good hypertension, Pt. on medications  Rhythm:Regular     Neuro/Psych negative neurological ROS     GI/Hepatic Neg liver ROS, GERD  Medicated,  Endo/Other  Hypothyroidism Morbid obesity  Renal/GU negative Renal ROS     Musculoskeletal   Abdominal (+) + obese,   Peds  Hematology negative hematology ROS (+)   Anesthesia Other Findings   Reproductive/Obstetrics                             Anesthesia Physical Anesthesia Plan  ASA: III  Anesthesia Plan: General   Post-op Pain Management:    Induction: Intravenous  PONV Risk Score and Plan:   Airway Management Planned: Natural Airway and Nasal Cannula  Additional Equipment:   Intra-op Plan:   Post-operative Plan:   Informed Consent: I have reviewed the patients History and Physical, chart, labs and discussed the procedure including the risks, benefits and alternatives for the proposed anesthesia with the patient or authorized representative who has indicated his/her understanding and acceptance.     Plan Discussed with: CRNA  Anesthesia Plan Comments:         Anesthesia Quick Evaluation

## 2017-01-30 ENCOUNTER — Encounter: Payer: Self-pay | Admitting: Internal Medicine

## 2017-07-24 ENCOUNTER — Ambulatory Visit: Payer: Medicaid Other

## 2017-08-04 ENCOUNTER — Ambulatory Visit: Payer: Self-pay

## 2017-08-04 ENCOUNTER — Ambulatory Visit (INDEPENDENT_AMBULATORY_CARE_PROVIDER_SITE_OTHER): Payer: Medicaid Other | Admitting: Urology

## 2017-08-04 ENCOUNTER — Encounter: Payer: Self-pay | Admitting: Urology

## 2017-08-04 VITALS — BP 152/106 | HR 73 | Ht 69.0 in | Wt 274.6 lb

## 2017-08-04 DIAGNOSIS — N393 Stress incontinence (female) (male): Secondary | ICD-10-CM | POA: Diagnosis not present

## 2017-08-04 LAB — URINALYSIS, COMPLETE
Bilirubin, UA: NEGATIVE
Glucose, UA: NEGATIVE
Ketones, UA: NEGATIVE
Leukocytes, UA: NEGATIVE
NITRITE UA: NEGATIVE
Protein, UA: NEGATIVE
Specific Gravity, UA: 1.015 (ref 1.005–1.030)
UUROB: 0.2 mg/dL (ref 0.2–1.0)
pH, UA: 5 (ref 5.0–7.5)

## 2017-08-04 LAB — MICROSCOPIC EXAMINATION: WBC UA: NONE SEEN /HPF (ref 0–5)

## 2017-08-04 MED ORDER — MIRABEGRON ER 50 MG PO TB24: 50.0000 mg | ORAL_TABLET | Freq: Every day | ORAL | 11 refills | Status: DC

## 2017-08-04 MED ORDER — SOLIFENACIN SUCCINATE 5 MG PO TABS: 5.0000 mg | ORAL_TABLET | Freq: Every day | ORAL | 11 refills | Status: DC

## 2017-08-04 NOTE — Progress Notes (Signed)
08/04/2017 10:36 AM   Katrina Murray 12-16-1970 [id number]  Referring provider: Alvina Filbert, MD [address] [place], [place] [address]  Chief Complaint  Patient presents with  . Urinary Incontinence    HPI:  Dictated along note 1 year ago.  The patient has a mild outlet abnormality and by history has urge incontinence and bedwetting and likely has a neurogenic bladder. 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 She does have a low pressure bladder. In my opinion she should not have a sling and would be at very high risk of long-term urinary retention. I urethral injectable would be a better option with the rare risk of her retention.   The patient had improved frequency and nocturia and incontinence on Vesicare as a partial responder.    Urge incontinence was better on Vesicare as a partial responder.  Clinically not infected.  Frequency stable.  She would rather not take medication but I mentioned that because of the multiple sclerosis there can be issues or contraindications for some of the refractory therapies and she is young for percutaneous tibial nerve stimulation's from an insurance standpoint and it may not be covered by Medicaid    PMH: Past Medical History:  Diagnosis Date  . Gastritis, chronic   . GERD (gastroesophageal reflux disease)   . Hypercholesteremia   . Hyperlipidemia   . Hypertension   . Hypothyroidism   . Multiple sclerosis [facility])     Surgical History: Past Surgical History:  Procedure Laterality Date  . ABDOMINAL HYSTERECTOMY    . COLONOSCOPY WITH PROPOFOL N/A 01/29/2017   Procedure: COLONOSCOPY WITH PROPOFOL;  Surgeon: Toledo, Boykin Nearing, MD;  Location: [facility] ENDOSCOPY;  Service: Gastroenterology;  Laterality: N/A;  . ESOPHAGOGASTRODUODENOSCOPY (EGD) WITH PROPOFOL N/A 07/12/2016   Procedure: ESOPHAGOGASTRODUODENOSCOPY (EGD) WITH PROPOFOL;  Surgeon: Christena Deem, MD;  Location: [facility] ENDOSCOPY;  Service: Endoscopy;  Laterality: N/A;  . PARTIAL HYSTERECTOMY    . TUBAL LIGATION      Home Medications:  Allergies as of 08/04/2017   No Known Allergies     Medication List        Accurate as of 08/04/17 10:36 AM. Always use your most recent med list.          glatiramer 20 MG/ML Sosy injection Commonly known as:  COPAXONE Inject 20 mg into the skin 3 (three) times a week.   levothyroxine 25 MCG tablet Commonly known as:  SYNTHROID, LEVOTHROID Take by mouth.   lisinopril 40 MG tablet Commonly known as:  PRINIVIL,ZESTRIL Take by mouth.   pantoprazole 40 MG tablet Commonly known as:  PROTONIX Take by mouth.   simvastatin 40 MG tablet Commonly known as:  ZOCOR Take by mouth.   topiramate 25 MG tablet Commonly known as:  TOPAMAX Take by mouth.       Allergies: No Known Allergies  Family History: Family History  Problem Relation Age of Onset  . Breast cancer Maternal Aunt   . Bladder Cancer Neg Hx   . Kidney cancer Neg Hx     Social History:  reports that she quit smoking about 11 years ago. She has never used smokeless tobacco. She reports that she does not drink alcohol or use drugs.  ROS: UROLOGY Frequent Urination?: Yes Hard to postpone urination?: No Burning/pain with urination?: No Get up at night to urinate?: Yes Leakage of urine?: Yes Urine stream starts and stops?: No Trouble starting stream?:  No Do you have to strain to urinate?: No Blood in urine?: No Urinary tract infection?: No Sexually transmitted disease?: No Injury to kidneys or bladder?: No Painful intercourse?: No Weak stream?: Yes Currently pregnant?: No Vaginal bleeding?: No Last menstrual period?: n  Gastrointestinal Nausea?: No Vomiting?: No Indigestion/heartburn?: No Diarrhea?: No Constipation?: No  Constitutional Fever: No Night sweats?: Yes Weight loss?: No Fatigue?: No  Skin Skin rash/lesions?: No Itching?:  No  Eyes Blurred vision?: Yes Double vision?: Yes  Ears/Nose/Throat Sore throat?: No Sinus problems?: No  Hematologic/Lymphatic Swollen glands?: No Easy bruising?: Yes  Cardiovascular Leg swelling?: No Chest pain?: No  Respiratory Cough?: No Shortness of breath?: No  Endocrine Excessive thirst?: No  Musculoskeletal Back pain?: Yes Joint pain?: Yes  Neurological Headaches?: Yes Dizziness?: No  Psychologic Depression?: No Anxiety?: No  Physical Exam: BP (!) 152/106 (BP Location: Right Arm, Patient Position: Sitting, Cuff Size: Large)   Pulse 73   Ht 5\' 9"  (1.753 m)   Wt 274 lb 9.6 oz (124.6 kg)   BMI 40.55 kg/m   Constitutional:  Alert and oriented, No acute distress.  Laboratory Data: Lab Results  Component Value Date   WBC 10.3 06/24/2016   HGB 14.6 06/24/2016   HCT 42.3 06/24/2016   MCV 82.7 06/24/2016   PLT 298 06/24/2016    Lab Results  Component Value Date   CREATININE 1.22 (H) 06/24/2016    No results found for: PSA  No results found for: TESTOSTERONE  No results found for: HGBA1C  Urinalysis    Component Value Date/Time   COLORURINE YELLOW (A) 06/24/2016 1117   APPEARANCEUR CLEAR (A) 06/24/2016 1117   APPEARANCEUR Hazy (A) 02/26/2016 1020   LABSPEC 1.015 06/24/2016 1117   PHURINE 5.0 06/24/2016 1117   GLUCOSEU NEGATIVE 06/24/2016 1117   HGBUR MODERATE (A) 06/24/2016 1117   BILIRUBINUR NEGATIVE 06/24/2016 1117   BILIRUBINUR Negative 02/26/2016 1020   KETONESUR NEGATIVE 06/24/2016 1117   PROTEINUR NEGATIVE 06/24/2016 1117   NITRITE NEGATIVE 06/24/2016 1117   LEUKOCYTESUR NEGATIVE 06/24/2016 1117   LEUKOCYTESUR Negative 02/26/2016 1020    Pertinent Imaging:   Assessment & Plan: I gave her Vesicare prescription.  I gave her Myrbetriq samples and prescription.  I will see her in 1 year on Myrbetriq or Vesicare  1. SUI (stress urinary incontinence, female)  - Urinalysis, Complete   No follow-ups on  file.  Martina Sinner, MD  [facility] [address], [address] [place], [place] [address] [phone]

## 2017-09-08 ENCOUNTER — Telehealth: Payer: Self-pay | Admitting: Urology

## 2017-09-08 NOTE — Telephone Encounter (Signed)
MacDiarmid gave pt 1 month worth of samples of Myrbetriq (not sure of mg) and he also gave her a written script.  Pt has Medicaid and they require for dr to give a description of why she needs to be on this medication in order for pharmacy to fill this.  [facility] uses [facility] on [address] in [place].

## 2017-09-09 MED ORDER — MIRABEGRON ER 50 MG PO TB24: 50.0000 mg | ORAL_TABLET | Freq: Every day | ORAL | 11 refills | Status: DC

## 2017-09-09 NOTE — Addendum Note (Signed)
Addended by: Vickki Hearing on: 09/09/2017 11:00 AM   Modules accepted: Orders

## 2017-09-09 NOTE — Telephone Encounter (Signed)
Done

## 2017-09-17 ENCOUNTER — Telehealth: Payer: Self-pay | Admitting: Urology

## 2017-09-17 NOTE — Telephone Encounter (Signed)
Pt called and left a message that she would like to speak to Dr MacDiarmid's nurse.  She didn't leave any further details.  Please give pt a call [phone]

## 2017-09-19 NOTE — Telephone Encounter (Signed)
Pt walked in wanting to talk to a nurse, needs to get medication filled. Pt states insurance needs a reason as to why she needs to take medication? Wants nurse to contact the pharmacy.  mirabegron ER (MYRBETRIQ) 50 MG TB24 tablet [[id number]]

## 2017-09-30 NOTE — Telephone Encounter (Signed)
Please respond to this patient please she has contacted the office again asking for someone to call her.   Marcelino Duster

## 2017-09-30 NOTE — Telephone Encounter (Signed)
Called pharm no answer

## 2017-10-01 ENCOUNTER — Telehealth: Payer: Self-pay

## 2017-10-01 NOTE — Telephone Encounter (Signed)
PA for Myrbetriq received, pt has medicaid and must try and fail two alternatives before coverage. Pt has tried and failed Vesicare already, so only one more alternative is needed.   Oxybutynin Oxybutynin ER Toviaz   I am currently waiting on provider response.

## 2017-10-01 NOTE — Telephone Encounter (Signed)
PA for Myrbetriq received, pt has medicaid and must try and fail two alternatives before coverage. Pt has tried and failed Vesicare already, so only one more alternative is needed.   Oxybutynin Oxybutynin ER Gala Murdoch

## 2017-10-03 MED ORDER — OXYBUTYNIN CHLORIDE ER 10 MG PO TB24: 10.0000 mg | ORAL_TABLET | Freq: Every day | ORAL | 3 refills | Status: DC

## 2017-10-03 NOTE — Telephone Encounter (Signed)
Pt informed, rx sent into pharmacy.  

## 2017-10-03 NOTE — Telephone Encounter (Signed)
Oxybutynin ER 10 mg 

## 2018-02-18 ENCOUNTER — Observation Stay: Payer: Medicaid Other

## 2018-02-18 ENCOUNTER — Emergency Department: Payer: Medicaid Other

## 2018-02-18 ENCOUNTER — Other Ambulatory Visit: Payer: Self-pay

## 2018-02-18 ENCOUNTER — Observation Stay
Admission: EM | Admit: 2018-02-18 | Discharge: 2018-02-19 | Disposition: A | Discharge status: Home / Self Care | Payer: Medicaid Other | Attending: Family Medicine | Admitting: Family Medicine

## 2018-02-18 ENCOUNTER — Encounter: Payer: Self-pay | Admitting: Emergency Medicine

## 2018-02-18 DIAGNOSIS — K2970 Gastritis, unspecified, without bleeding: Secondary | ICD-10-CM | POA: Insufficient documentation

## 2018-02-18 DIAGNOSIS — E039 Hypothyroidism, unspecified: Secondary | ICD-10-CM | POA: Diagnosis not present

## 2018-02-18 DIAGNOSIS — Z79899 Other long term (current) drug therapy: Secondary | ICD-10-CM | POA: Diagnosis not present

## 2018-02-18 DIAGNOSIS — E7800 Pure hypercholesterolemia, unspecified: Secondary | ICD-10-CM | POA: Diagnosis not present

## 2018-02-18 DIAGNOSIS — K219 Gastro-esophageal reflux disease without esophagitis: Secondary | ICD-10-CM | POA: Insufficient documentation

## 2018-02-18 DIAGNOSIS — R109 Unspecified abdominal pain: Secondary | ICD-10-CM | POA: Diagnosis not present

## 2018-02-18 DIAGNOSIS — R079 Chest pain, unspecified: Secondary | ICD-10-CM | POA: Diagnosis present

## 2018-02-18 DIAGNOSIS — G35 Multiple sclerosis: Secondary | ICD-10-CM | POA: Diagnosis not present

## 2018-02-18 DIAGNOSIS — R001 Bradycardia, unspecified: Secondary | ICD-10-CM | POA: Diagnosis not present

## 2018-02-18 DIAGNOSIS — I700 Atherosclerosis of aorta: Secondary | ICD-10-CM | POA: Insufficient documentation

## 2018-02-18 DIAGNOSIS — Z79890 Hormone replacement therapy (postmenopausal): Secondary | ICD-10-CM | POA: Diagnosis not present

## 2018-02-18 DIAGNOSIS — Z7982 Long term (current) use of aspirin: Secondary | ICD-10-CM | POA: Diagnosis not present

## 2018-02-18 DIAGNOSIS — N281 Cyst of kidney, acquired: Secondary | ICD-10-CM | POA: Insufficient documentation

## 2018-02-18 DIAGNOSIS — K760 Fatty (change of) liver, not elsewhere classified: Secondary | ICD-10-CM | POA: Diagnosis not present

## 2018-02-18 DIAGNOSIS — Z87891 Personal history of nicotine dependence: Secondary | ICD-10-CM | POA: Diagnosis not present

## 2018-02-18 DIAGNOSIS — I10 Essential (primary) hypertension: Secondary | ICD-10-CM | POA: Insufficient documentation

## 2018-02-18 DIAGNOSIS — Z8249 Family history of ischemic heart disease and other diseases of the circulatory system: Secondary | ICD-10-CM | POA: Diagnosis not present

## 2018-02-18 DIAGNOSIS — K7689 Other specified diseases of liver: Secondary | ICD-10-CM | POA: Insufficient documentation

## 2018-02-18 DIAGNOSIS — E785 Hyperlipidemia, unspecified: Secondary | ICD-10-CM | POA: Insufficient documentation

## 2018-02-18 LAB — CBC
HCT: 51 % — ABNORMAL HIGH (ref 36.0–46.0)
HCT: 46.7 % — ABNORMAL HIGH (ref 36.0–46.0)
Hemoglobin: 16.8 g/dL — ABNORMAL HIGH (ref 12.0–15.0)
Hemoglobin: 15.5 g/dL — ABNORMAL HIGH (ref 12.0–15.0)
MCH: 28.1 pg (ref 26.0–34.0)
MCH: 28.1 pg (ref 26.0–34.0)
MCHC: 32.9 g/dL (ref 30.0–36.0)
MCHC: 33.2 g/dL (ref 30.0–36.0)
MCV: 85.4 fL (ref 80.0–100.0)
MCV: 84.6 fL (ref 80.0–100.0)
NRBC: 0 % (ref 0.0–0.2)
Platelets: 376 10*3/uL (ref 150–400)
Platelets: 325 10*3/uL (ref 150–400)
RBC: 5.97 MIL/uL — ABNORMAL HIGH (ref 3.87–5.11)
RBC: 5.52 MIL/uL — ABNORMAL HIGH (ref 3.87–5.11)
RDW: 13.2 % (ref 11.5–15.5)
RDW: 13.1 % (ref 11.5–15.5)
WBC: 9.8 10*3/uL (ref 4.0–10.5)
WBC: 9.9 10*3/uL (ref 4.0–10.5)
nRBC: 0 % (ref 0.0–0.2)

## 2018-02-18 LAB — BASIC METABOLIC PANEL
Anion gap: 11 (ref 5–15)
BUN: 21 mg/dL — ABNORMAL HIGH (ref 6–20)
CO2: 21 mmol/L — AB (ref 22–32)
Calcium: 9.7 mg/dL (ref 8.9–10.3)
Chloride: 106 mmol/L (ref 98–111)
Creatinine, Ser: 1.17 mg/dL — ABNORMAL HIGH (ref 0.44–1.00)
GFR calc Af Amer: >60 mL/min (ref >60)
GFR calc non Af Amer: 55 mL/min — ABNORMAL LOW (ref >60)
Glucose, Bld: 107 mg/dL — ABNORMAL HIGH (ref 70–99)
Potassium: 3.8 mmol/L (ref 3.5–5.1)
Sodium: 138 mmol/L (ref 135–145)

## 2018-02-18 LAB — TROPONIN I
Troponin I: <0.03 ng/mL (ref <0.03)
Troponin I: <0.03 ng/mL (ref <0.03)
Troponin I: <0.03 ng/mL (ref <0.03)
Troponin I: <0.03 ng/mL (ref <0.03)

## 2018-02-18 LAB — CREATININE, SERUM
Creatinine, Ser: 1.01 mg/dL — ABNORMAL HIGH (ref 0.44–1.00)
GFR calc Af Amer: >60 mL/min (ref >60)
GFR calc non Af Amer: >60 mL/min (ref >60)

## 2018-02-18 MED ORDER — PANTOPRAZOLE SODIUM 40 MG PO TBEC
40.0000 mg | DELAYED_RELEASE_TABLET | Freq: Every evening | ORAL | Status: DC
  Administered 2018-02-18: 40 mg via ORAL
  Filled 2018-02-18: qty 1

## 2018-02-18 MED ORDER — HYDROCHLOROTHIAZIDE 12.5 MG PO CAPS
12.5000 mg | ORAL_CAPSULE | Freq: Every evening | ORAL | Status: DC
  Administered 2018-02-18: 12.5 mg via ORAL
  Filled 2018-02-18: qty 1

## 2018-02-18 MED ORDER — DICYCLOMINE HCL 10 MG/5ML PO SOLN
10.0000 mg | Freq: Once | ORAL | Status: DC
  Filled 2018-02-18: qty 5

## 2018-02-18 MED ORDER — ALUM & MAG HYDROXIDE-SIMETH 200-200-20 MG/5ML PO SUSP
30.0000 mL | Freq: Once | ORAL | Status: AC
  Administered 2018-02-18: 30 mL via ORAL
  Filled 2018-02-18: qty 30

## 2018-02-18 MED ORDER — LIDOCAINE VISCOUS HCL 2 % MT SOLN
15.0000 mL | Freq: Once | OROMUCOSAL | Status: AC
  Administered 2018-02-18: 15 mL via ORAL
  Filled 2018-02-18: qty 15

## 2018-02-18 MED ORDER — LEVOTHYROXINE SODIUM 25 MCG PO TABS
25.0000 ug | ORAL_TABLET | Freq: Every day | ORAL | Status: DC
  Administered 2018-02-19: 25 ug via ORAL
  Filled 2018-02-18: qty 1

## 2018-02-18 MED ORDER — ENOXAPARIN SODIUM 40 MG/0.4ML ~~LOC~~ SOLN
40.0000 mg | SUBCUTANEOUS | Status: DC
  Administered 2018-02-18: 40 mg via SUBCUTANEOUS
  Filled 2018-02-18: qty 0.4

## 2018-02-18 MED ORDER — MORPHINE SULFATE (PF) 2 MG/ML IV SOLN: 2.0000 mg | INTRAVENOUS | Status: DC | PRN

## 2018-02-18 MED ORDER — NITROGLYCERIN 0.4 MG SL SUBL
0.4000 mg | SUBLINGUAL_TABLET | SUBLINGUAL | Status: DC | PRN
  Administered 2018-02-18 (×2): 0.4 mg via SUBLINGUAL
  Filled 2018-02-18: qty 1

## 2018-02-18 MED ORDER — HYOSCYAMINE SULFATE 0.125 MG SL SUBL
0.2500 mg | SUBLINGUAL_TABLET | Freq: Once | SUBLINGUAL | Status: DC
  Filled 2018-02-18: qty 2

## 2018-02-18 MED ORDER — ASPIRIN EC 325 MG PO TBEC
325.0000 mg | DELAYED_RELEASE_TABLET | Freq: Every day | ORAL | Status: DC
  Administered 2018-02-18 – 2018-02-19 (×2): 325 mg via ORAL
  Filled 2018-02-18 (×3): qty 1

## 2018-02-18 MED ORDER — SODIUM CHLORIDE 0.9 % IV BOLUS
1000.0000 mL | Freq: Once | INTRAVENOUS | Status: AC
  Administered 2018-02-18: 1000 mL via INTRAVENOUS

## 2018-02-18 MED ORDER — TOPIRAMATE 100 MG PO TABS
100.0000 mg | ORAL_TABLET | Freq: Every day | ORAL | Status: DC
  Administered 2018-02-18: 100 mg via ORAL
  Filled 2018-02-18 (×2): qty 1

## 2018-02-18 MED ORDER — LISINOPRIL 20 MG PO TABS
40.0000 mg | ORAL_TABLET | Freq: Every evening | ORAL | Status: DC
  Administered 2018-02-18: 40 mg via ORAL
  Filled 2018-02-18: qty 2

## 2018-02-18 MED ORDER — IOPAMIDOL (ISOVUE-300) INJECTION 61%
100.0000 mL | Freq: Once | INTRAVENOUS | Status: AC | PRN
  Administered 2018-02-18: 100 mL via INTRAVENOUS

## 2018-02-18 MED ORDER — ALPRAZOLAM 0.25 MG PO TABS
0.2500 mg | ORAL_TABLET | Freq: Two times a day (BID) | ORAL | Status: DC | PRN
  Filled 2018-02-18: qty 1

## 2018-02-18 MED ORDER — METOPROLOL TARTRATE 25 MG PO TABS
25.0000 mg | ORAL_TABLET | Freq: Two times a day (BID) | ORAL | Status: DC
  Administered 2018-02-18 – 2018-02-19 (×2): 25 mg via ORAL
  Filled 2018-02-18 (×2): qty 1

## 2018-02-18 MED ORDER — DIMETHYL FUMARATE 240 MG PO CPDR: 240.0000 mg | DELAYED_RELEASE_CAPSULE | Freq: Two times a day (BID) | ORAL | Status: DC

## 2018-02-18 MED ORDER — HYDRALAZINE HCL 20 MG/ML IJ SOLN: 10.0000 mg | INTRAMUSCULAR | Status: DC | PRN

## 2018-02-18 MED ORDER — ONDANSETRON HCL 4 MG/2ML IJ SOLN: 4.0000 mg | Freq: Four times a day (QID) | INTRAMUSCULAR | Status: DC | PRN

## 2018-02-18 MED ORDER — IOPAMIDOL (ISOVUE-300) INJECTION 61%
30.0000 mL | Freq: Once | INTRAVENOUS | Status: AC | PRN
  Administered 2018-02-18: 30 mL via ORAL

## 2018-02-18 MED ORDER — ACETAMINOPHEN 325 MG PO TABS
650.0000 mg | ORAL_TABLET | ORAL | Status: DC | PRN
  Administered 2018-02-18: 650 mg via ORAL
  Filled 2018-02-18: qty 2

## 2018-02-18 MED ORDER — ASPIRIN 81 MG PO CHEW
162.0000 mg | CHEWABLE_TABLET | Freq: Once | ORAL | Status: AC
  Administered 2018-02-18: 162 mg via ORAL
  Filled 2018-02-18: qty 2

## 2018-02-18 MED ORDER — NITROGLYCERIN 2 % TD OINT
1.0000 [in_us] | TOPICAL_OINTMENT | Freq: Once | TRANSDERMAL | Status: AC
  Administered 2018-02-18: 1 [in_us] via TOPICAL
  Filled 2018-02-18: qty 1

## 2018-02-18 MED ORDER — SIMVASTATIN 20 MG PO TABS
40.0000 mg | ORAL_TABLET | Freq: Every evening | ORAL | Status: DC
  Administered 2018-02-18: 40 mg via ORAL
  Filled 2018-02-18: qty 2

## 2018-02-18 NOTE — ED Notes (Signed)
Patient back from ct scan.

## 2018-02-18 NOTE — ED Triage Notes (Signed)
Left sided cp began during the night, woke her up from her sleep, tightness in chest with no radiating, states positive family heart hx. Appears in anxious.

## 2018-02-18 NOTE — ED Notes (Signed)
ED TO INPATIENT HANDOFF REPORT  Name/Age/Gender Katrina Murray 48 y.o. female  Code Status    Code Status Orders  (From admission, onward)         Start     Ordered   02/18/18 1409  Full code  Continuous     02/18/18 1409        Code Status History    This patient has a current code status but no historical code status.      Home/SNF/Other Patient from home.  Chief Complaint cp  Level of Care/Admitting Diagnosis ED Disposition    ED Disposition Condition Comment   Admit  Hospital Area: [facility] [[id number]]  Level of Care: Med-Surg [16]  Diagnosis: Chest pain [147092]  Admitting Physician: Bertrum Sol [[id number]]  Attending Physician: Bertrum Sol [[id number]]  PT Class (Do Not Modify): Observation [104]  PT Acc Code (Do Not Modify): Observation [10022]       Medical History Past Medical History:  Diagnosis Date  . Gastritis, chronic   . GERD (gastroesophageal reflux disease)   . Hypercholesteremia   . Hyperlipidemia   . Hypertension   . Hypothyroidism   . Multiple sclerosis (HCC)     Allergies No Known Allergies  IV Location/Drains/Wounds Patient Lines/Drains/Airways Status   Active Line/Drains/Airways    Name:   Placement date:   Placement time:   Site:   Days:   Peripheral IV 02/18/18 Left Antecubital   02/18/18    0754    Antecubital   less than 1   Airway   07/12/16    1517     586          Labs/Imaging Results for orders placed or performed during the hospital encounter of 02/18/18 (from the past 48 hour(s))  CBC     Status: Abnormal   Collection Time: 02/18/18  7:58 AM  Result Value Ref Range   WBC 9.8 4.0 - 10.5 K/uL   RBC 5.97 (H) 3.87 - 5.11 MIL/uL   Hemoglobin 16.8 (H) 12.0 - 15.0 g/dL   HCT 38.3 (H) 81.8 - 40.3 %   MCV 85.4 80.0 - 100.0 fL   MCH 28.1 26.0 - 34.0 pg   MCHC 32.9 30.0 - 36.0 g/dL   RDW 75.4 36.0 - 67.7 %   Platelets 376 150 - 400 K/uL   nRBC 0.0 0.0 - 0.2 %    Comment: Performed  at [facility], [address] [place], [place] [address]  Basic metabolic panel     Status: Abnormal   Collection Time: 02/18/18  7:58 AM  Result Value Ref Range   Sodium 138 135 - 145 mmol/L   Potassium 3.8 3.5 - 5.1 mmol/L   Chloride 106 98 - 111 mmol/L   CO2 21 (L) 22 - 32 mmol/L   Glucose, Bld 107 (H) 70 - 99 mg/dL   BUN 21 (H) 6 - 20 mg/dL   Creatinine, Ser 5.24 (H) 0.44 - 1.00 mg/dL   Calcium 9.7 8.9 - 81.8 mg/dL   GFR calc non Af Amer 55 (L) >60 mL/min   GFR calc Af Amer >60 >60 mL/min   Anion gap 11 5 - 15    Comment: Performed at [facility], [address] [place], [place] [address]  Troponin I - ONCE - STAT     Status: None   Collection Time: 02/18/18  7:58 AM  Result Value Ref Range   Troponin I <0.03 <0.03 ng/mL  Comment: Performed at [facility], [address] [place], [place] [address]  Troponin I - Now Then [facility]     Status: None   Collection Time: 02/18/18  2:12 PM  Result Value Ref Range   Troponin I <0.03 <0.03 ng/mL    Comment: Performed at [facility], [address] [place], [place] [address]  CBC     Status: Abnormal   Collection Time: 02/18/18  2:12 PM  Result Value Ref Range   WBC 9.9 4.0 - 10.5 K/uL   RBC 5.52 (H) 3.87 - 5.11 MIL/uL   Hemoglobin 15.5 (H) 12.0 - 15.0 g/dL   HCT 20.3 (H) 55.9 - 74.1 %   MCV 84.6 80.0 - 100.0 fL   MCH 28.1 26.0 - 34.0 pg   MCHC 33.2 30.0 - 36.0 g/dL   RDW 63.8 45.3 - 64.6 %   Platelets 325 150 - 400 K/uL   nRBC 0.0 0.0 - 0.2 %    Comment: Performed at [facility], [address] [place], [place] [address]  Creatinine, serum     Status: Abnormal   Collection Time: 02/18/18  2:12 PM  Result Value Ref Range   Creatinine, Ser 1.01 (H) 0.44 - 1.00 mg/dL   GFR calc non Af Amer >60 >60 mL/min   GFR calc Af Amer >60 >60 mL/min    Comment: Performed at [facility], [address], [place], [place] [address]   Dg Chest 2 View  Result Date:  02/18/2018 CLINICAL DATA:  Chest pain EXAM: CHEST - 2 VIEW COMPARISON:  January 17, 2016 FINDINGS: No edema or consolidation. The heart size and pulmonary vascularity are normal. No adenopathy. There is mild degenerative change in the thoracic spine. No pneumothorax. IMPRESSION: No edema or consolidation. Electronically Signed   By: Bretta Bang III M.D.   On: 02/18/2018 08:30   Ct Abdomen Pelvis W Contrast  Result Date: 02/18/2018 CLINICAL DATA:  Left-sided abdominal pain. EXAM: CT ABDOMEN AND PELVIS WITH CONTRAST TECHNIQUE: Multidetector CT imaging of the abdomen and pelvis was performed using the standard protocol following bolus administration of intravenous contrast. CONTRAST:  ISOVUE-300 IOPAMIDOL (ISOVUE-300) INJECTION 61% COMPARISON:  CT scan of Jun 24, 2016. FINDINGS: Lower chest: No acute abnormality. Hepatobiliary: No gallstones or biliary dilatation is noted. Hepatic steatosis. Stable multiple hepatic cysts are noted. Pancreas: Unremarkable. No pancreatic ductal dilatation or surrounding inflammatory changes. Spleen: Normal in size without focal abnormality. Adrenals/Urinary Tract: Adrenal glands appear normal. Stable right renal cyst is noted. No hydronephrosis or renal obstruction is noted. No renal or ureteral calculi are noted. Urinary bladder is unremarkable. Stomach/Bowel: Stomach is within normal limits. Appendix appears normal. No evidence of bowel wall thickening, distention, or inflammatory changes. Vascular/Lymphatic: Aortic atherosclerosis. No enlarged abdominal or pelvic lymph nodes. Reproductive: Status post hysterectomy. No adnexal abnormality is noted. Stable vaginal or periurethral cyst is noted. Other: No abdominal wall hernia or abnormality. No abdominopelvic ascites. 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 No acute osseous abnormality is noted. IMPRESSION: No acute abnormality seen in the abdomen or pelvis. Stable  hepatic steatosis.  Stable multiple hepatic cysts. Aortic Atherosclerosis (ICD10-I70.0). Electronically Signed   By: Lupita Raider, M.D.   On: 02/18/2018 13:10    Pending Labs Unresulted Labs (From admission, onward)    Start     Ordered   02/25/18 0500  Creatinine, serum  (enoxaparin (LOVENOX)    CrCl >/= 30 ml/min)  Weekly,  STAT    Comments:  while on enoxaparin therapy    02/18/18 1409   02/19/18 0500  Basic metabolic panel  Tomorrow morning,   STAT     02/18/18 1442   02/18/18 1409  HIV antibody (Routine Testing)  Once,   STAT     02/18/18 1409   02/18/18 1409  Troponin I - Now Then Q3H  Now then every 3 hours,   STAT     02/18/18 1409          Vitals/Pain Today's Vitals   02/18/18 1345 02/18/18 1400 02/18/18 1404 02/18/18 1430  BP:  (!) 140/94 (!) 140/94 (!) 140/126  Pulse:   (!) 56   Resp: 13 13 13 16   Temp:      TempSrc:      SpO2:   97%   Weight:      Height:      PainSc:   0-No pain     Isolation Precautions No active isolations  Medications Medications  nitroGLYCERIN (NITROSTAT) SL tablet 0.4 mg (0.4 mg Sublingual Given 02/18/18 0822)  hydrochlorothiazide (MICROZIDE) capsule 12.5 mg (has no administration in time range)  lisinopril (PRINIVIL,ZESTRIL) tablet 40 mg (has no administration in time range)  simvastatin (ZOCOR) tablet 40 mg (has no administration in time range)  Dimethyl Fumarate CPDR 240 mg (has no administration in time range)  levothyroxine (SYNTHROID, LEVOTHROID) tablet 25 mcg (has no administration in time range)  pantoprazole (PROTONIX) EC tablet 40 mg (has no administration in time range)  topiramate (TOPAMAX) tablet 100 mg (has no administration in time range)  acetaminophen (TYLENOL) tablet 650 mg (has no administration in time range)  ondansetron (ZOFRAN) injection 4 mg (has no administration in time range)  enoxaparin (LOVENOX) injection 40 mg (40 mg Subcutaneous Given 02/18/18 1429)  morphine 2 MG/ML injection 2 mg (has no  administration in time range)  hyoscyamine (LEVSIN SL) SL tablet 0.25 mg (has no administration in time range)  dicyclomine (BENTYL) 10 MG/5ML syrup 10 mg (has no administration in time range)  metoprolol tartrate (LOPRESSOR) tablet 25 mg (has no administration in time range)  aspirin EC tablet 325 mg (has no administration in time range)  ALPRAZolam (XANAX) tablet 0.25 mg (has no administration in time range)  hydrALAZINE (APRESOLINE) injection 10 mg (has no administration in time range)  sodium chloride 0.9 % bolus 1,000 mL (0 mLs Intravenous Stopped 02/18/18 1022)  aspirin chewable tablet 162 mg (162 mg Oral Given 02/18/18 0805)  nitroGLYCERIN (NITROGLYN) 2 % ointment 1 inch (1 inch Topical Given 02/18/18 0842)  alum & mag hydroxide-simeth (MAALOX/MYLANTA) 200-200-20 MG/5ML suspension 30 mL (30 mLs Oral Given 02/18/18 1427)  alum & mag hydroxide-simeth (MAALOX/MYLANTA) 200-200-20 MG/5ML suspension 30 mL (30 mLs Oral Given 02/18/18 1426)    And  lidocaine (XYLOCAINE) 2 % viscous mouth solution 15 mL (15 mLs Oral Given 02/18/18 1426)  iopamidol (ISOVUE-300) 61 % injection 30 mL (30 mLs Oral Contrast Given 02/18/18 1145)  iopamidol (ISOVUE-300) 61 % injection 100 mL (100 mLs Intravenous Contrast Given 02/18/18 1244)    Mobility Ambulatory

## 2018-02-18 NOTE — ED Notes (Signed)
Patient with anxiety about being admitted. In room crying. Family upset wants her transferred to a room. Explained to patient soon as room becomes available we will move her out of ed. Patient also requesting something to eat. Admit orderes reviewed with patient. As per orders patient NPO.

## 2018-02-18 NOTE — ED Notes (Signed)
Patient transported to X-ray 

## 2018-02-18 NOTE — ED Notes (Signed)
Patient c/o of pain to left side abd, md aware. Patient awaiting CT abd.

## 2018-02-18 NOTE — ED Provider Notes (Addendum)
[facility] Emergency Department Provider Note  ____________________________________________  Time seen: Approximately 7:58 AM  I have reviewed the triage vital signs and the nursing notes.   HISTORY  Chief Complaint Chest Pain    HPI Katrina Murray is a 48 y.o. female the history of HTN, HL, GERD, MS and obesity presenting with chest pain.  The patient reports that 1 month ago, she had a brief episode of a central, slightly left-sided chest tightness that woke her up from sleep and resolved after taking a low-dose aspirin.  Since then, she has not had any additional chest pain, including with exertion.  Overnight, the patient had 2 separate episodes where she woke up with a "compression" sensation in the midline and slightly left of the midline in the chest without any associated diaphoresis, radiation, palpitations, nausea or vomiting, or shortness of breath.  She did have some burping afterwards, but did not have any acid taste, nor did she eat spicy foods or large meal last night.  There was nothing she could do to make the pain better or worse.  She continues to have discomfort at this time.  Last stress test was several years ago; she has never had a cardiac catheterization.  FH: brother died of MI at age 58  SH: Denies tobacco or cocaine.  Past Medical History:  Diagnosis Date  . Gastritis, chronic   . GERD (gastroesophageal reflux disease)   . Hypercholesteremia   . Hyperlipidemia   . Hypertension   . Hypothyroidism   . Multiple sclerosis (HCC)     There are no active problems to display for this patient.   Past Surgical History:  Procedure Laterality Date  . ABDOMINAL HYSTERECTOMY    . COLONOSCOPY WITH PROPOFOL N/A 01/29/2017   Procedure: COLONOSCOPY WITH PROPOFOL;  Surgeon: Toledo, Boykin Nearing, MD;  Location: [facility] ENDOSCOPY;  Service: Gastroenterology;  Laterality: N/A;  . ESOPHAGOGASTRODUODENOSCOPY (EGD) WITH PROPOFOL N/A 07/12/2016    Procedure: ESOPHAGOGASTRODUODENOSCOPY (EGD) WITH PROPOFOL;  Surgeon: Christena Deem, MD;  Location: [facility] ENDOSCOPY;  Service: Endoscopy;  Laterality: N/A;  . PARTIAL HYSTERECTOMY    . TUBAL LIGATION      Current Outpatient Rx  . Order #: [id number] Class: Historical Med  . Order #: [id number] Class: Historical Med  . Order #: [id number] Class: Historical Med  . Order #: [id number] Class: Print  . Order #: [id number] Class: Normal  . Order #: [id number] Class: Historical Med  . Order #: [id number] Class: Historical Med  . Order #: [id number] Class: Print  . Order #: [id number] Class: Historical Med    Allergies Patient has no known allergies.  Family History  Problem Relation Age of Onset  . Breast cancer Maternal Aunt   . Bladder Cancer Neg Hx   . Kidney cancer Neg Hx     Social History Social History   Tobacco Use  . Smoking status: Former Smoker    Last attempt to quit: 2008    Years since quitting: 12.0  . Smokeless tobacco: Never Used  Substance Use Topics  . Alcohol use: No    Comment: occasional  . Drug use: No    Review of Systems Constitutional: No fever/chills.  No lightheadedness or syncope.  No diaphoresis. Eyes: No visual changes. ENT: No sore throat. No congestion or rhinorrhea. Cardiovascular: Positive chest pain. Denies palpitations. Respiratory: Denies shortness of breath.  No cough. Gastrointestinal: No abdominal pain.  No nausea, no vomiting.  No diarrhea.  No constipation.  Positive burping. Genitourinary: Negative for dysuria. Musculoskeletal: Negative for back  pain. Skin: Negative for rash. Neurological: Negative for headaches. No focal numbness, tingling or weakness.     ____________________________________________   PHYSICAL EXAM:  VITAL SIGNS: ED Triage Vitals  Enc Vitals Group     BP 02/18/18 0740 (!) 148/106     Pulse Rate 02/18/18 0740 65     Resp 02/18/18 0740 (!) 22     Temp 02/18/18 0740 97.9 F (36.6 C)     Temp Source 02/18/18 0740  Oral     SpO2 02/18/18 0740 96 %     Weight 02/18/18 0740 268 lb (121.6 kg)     Height 02/18/18 0740 5\' 9"  (1.753 m)     Head Circumference --      Peak Flow --      Pain Score 02/18/18 0742 8     Pain Loc --      Pain Edu? --      Excl. in GC? --     Constitutional: Alert and oriented. Answers questions appropriately.  The patient is tearful and uncomfortable appearing. Eyes: Conjunctivae are normal.  EOMI. No scleral icterus. Head: Atraumatic. Nose: No congestion/rhinnorhea. Mouth/Throat: Mucous membranes are moist.  Neck: No stridor.  Supple.  No JVD.  No meningismus. Cardiovascular: Normal rate, regular rhythm. No murmurs, rubs or gallops.  Respiratory: Normal respiratory effort.  No accessory muscle use or retractions. Lungs CTAB.  No wheezes, rales or ronchi. Gastrointestinal: Obese.  Soft, nontender and nondistended.  No guarding or rebound.  No peritoneal signs. Musculoskeletal: No LE edema. No ttp in the calves or palpable cords.  Negative Homan's sign. Neurologic:  A&Ox3.  Speech is clear.  Face and smile are symmetric.  EOMI.  Moves all extremities well. Skin:  Skin is warm, dry and intact. No rash noted. Psychiatric: Mood and affect are normal. Speech and behavior are normal.  Normal judgement.  ____________________________________________   LABS (all labs ordered are listed, but only abnormal results are displayed)  Labs Reviewed  CBC - Abnormal; Notable for the following components:      Result Value   RBC 5.97 (*)    Hemoglobin 16.8 (*)    HCT 51.0 (*)    All other components within normal limits  BASIC METABOLIC PANEL - Abnormal; Notable for the following components:   CO2 21 (*)    Glucose, Bld 107 (*)    BUN 21 (*)    Creatinine, Ser 1.17 (*)    GFR calc non Af Amer 55 (*)    All other components within normal limits  TROPONIN I  TROPONIN I   ____________________________________________  EKG  ED ECG REPORT I, Anne-Caroline Sharma Covert, the attending  physician, personally viewed and interpreted this ECG.   Date: 02/18/2018  EKG Time: 736  Rate: 57  Rhythm: sinus bradycardia  Axis: normal  Intervals:none  ST&T Change: No STEMI  ____________________________________________  RADIOLOGY  Dg Chest 2 View  Result Date: 02/18/2018 CLINICAL DATA:  Chest pain EXAM: CHEST - 2 VIEW COMPARISON:  January 17, 2016 FINDINGS: No edema or consolidation. The heart size and pulmonary vascularity are normal. No adenopathy. There is mild degenerative change in the thoracic spine. No pneumothorax. IMPRESSION: No edema or consolidation. Electronically Signed   By: Bretta Bang III M.D.   On: 02/18/2018 08:30    ____________________________________________   PROCEDURES  Procedure(s) performed: None  Procedures  Critical Care performed: No ____________________________________________   INITIAL IMPRESSION / ASSESSMENT AND PLAN / ED COURSE  Pertinent labs & imaging results that were  available during my care of the patient were reviewed by me and considered in my medical decision making (see chart for details).  48 y.o. female with a history of HTN, HL, and strong family history of early CAD presenting with chest tightness.  Overall, the patient is hypertensive at 137/119.  I am concerned with her cardiac risk factors, and the description of her chest pain about ACS or MI.  Her EKG does not show any ischemic changes or arrhythmia.  Her troponin is pending we will get a second troponin.  I will treat the patient with aspirin; she has already received an 81 mg tablet at home and she will receive 162 mg here.  In addition, she will have nitroglycerin to see if we are able to alleviate her pain.  Other pathologies, including GI pathology such as reflux or ulcer are possible, especially since she has been having some burping although she states that she is compliant with her pantoprazole and that this does not feel like her prior GERD.  PE is considered  but again less likely that; the pain is nonpleuritic, she has no evidence of DVT, no tachycardia and no hypoxia.  Aortic pathology is also considered but unlikely.  Plan reevaluation for final disposition.  ----------------------------------------- 8:30 AM on 02/18/2018 -----------------------------------------  Patient's pain has gone from an 8 out of 10 to a 4 out of 10 with sublingual nitroglycerin; Nitropaste has been ordered.  The patient will be admitted to the hospital for continued cardiac evaluation.  ____________________________________________  FINAL CLINICAL IMPRESSION(S) / ED DIAGNOSES  Final diagnoses:  Chest pain, unspecified type         NEW MEDICATIONS STARTED DURING THIS VISIT:  New Prescriptions   No medications on file      Rockne MenghiniNorman, Anne-Caroline, MD 02/18/18 0830    Rockne MenghiniNorman, Anne-Caroline, MD 02/18/18 [id number]

## 2018-02-18 NOTE — Progress Notes (Signed)
Family Meeting Note  Advance Directive:yes  Today a meeting took place with the Patient.  Patient is able to participate   The following clinical team members were present during this meeting:MD  The following were discussed:Patient's diagnosis:cp, abd pain , Patient's progosis: Unable to determine and Goals for treatment: Full Code  Additional follow-up to be provided: prn  Time spent during discussion:20 minutes  Bertrum Sol, MD

## 2018-02-18 NOTE — ED Notes (Signed)
Off unit to CT

## 2018-02-18 NOTE — H&P (Signed)
[employer] [facility] [place] [facility]   PATIENT NAME: Katrina Murray    MR#:  [id number]  DATE OF BIRTH:  08-22-1970  DATE OF ADMISSION:  02/18/2018  PRIMARY CARE PHYSICIAN: Alvina Filbert, MD   REQUESTING/REFERRING PHYSICIAN:   CHIEF COMPLAINT:   Chief Complaint  Patient presents with  . Chest Pain    HISTORY OF PRESENT ILLNESS: Quynh Ruzicka  is a 48 y.o. female with a known history per below presents emergency room with acute onset chest pain without radiation, denies any shortness of breath or diaphoresis, in the emergency room ER work-up was unimpressive, patient evaluated at the bedside in the emergency room, noted to be in mild discomfort, complaining of severe left-sided abdominal pain, denies any nausea/emesis, patient stating that she would like something to eat however, patient is now been admitted for acute chest pain and abdominal pain secondary to unknown etiology.  PAST MEDICAL HISTORY:   Past Medical History:  Diagnosis Date  . Gastritis, chronic   . GERD (gastroesophageal reflux disease)   . Hypercholesteremia   . Hyperlipidemia   . Hypertension   . Hypothyroidism   . Multiple sclerosis (HCC)     PAST SURGICAL HISTORY:  Past Surgical History:  Procedure Laterality Date  . ABDOMINAL HYSTERECTOMY    . COLONOSCOPY WITH PROPOFOL N/A 01/29/2017   Procedure: COLONOSCOPY WITH PROPOFOL;  Surgeon: Toledo, Boykin Nearing, MD;  Location: [facility] ENDOSCOPY;  Service: Gastroenterology;  Laterality: N/A;  . ESOPHAGOGASTRODUODENOSCOPY (EGD) WITH PROPOFOL N/A 07/12/2016   Procedure: ESOPHAGOGASTRODUODENOSCOPY (EGD) WITH PROPOFOL;  Surgeon: Christena Deem, MD;  Location: [facility] ENDOSCOPY;  Service: Endoscopy;  Laterality: N/A;  . PARTIAL HYSTERECTOMY    . TUBAL LIGATION      SOCIAL HISTORY:  Social History   Tobacco Use  . Smoking status: Former Smoker    Last attempt to quit: 2008    Years since quitting: 12.0  . Smokeless tobacco: Never Used  Substance Use  Topics  . Alcohol use: No    Comment: occasional    FAMILY HISTORY:  Family History  Problem Relation Age of Onset  . Breast cancer Maternal Aunt   . Bladder Cancer Neg Hx   . Kidney cancer Neg Hx     DRUG ALLERGIES: No Known Allergies  REVIEW OF SYSTEMS:   CONSTITUTIONAL: No fever, fatigue or weakness.  EYES: No blurred or double vision.  EARS, NOSE, AND THROAT: No tinnitus or ear pain.  RESPIRATORY: No cough, shortness of breath, wheezing or hemoptysis.  CARDIOVASCULAR: + chest pain, no orthopnea, edema.  GASTROINTESTINAL: No nausea, vomiting, diarrhea or +abdominal pain.  GENITOURINARY: No dysuria, hematuria.  ENDOCRINE: No polyuria, nocturia,  HEMATOLOGY: No anemia, easy bruising or bleeding SKIN: No rash or lesion. MUSCULOSKELETAL: No joint pain or arthritis.   NEUROLOGIC: No tingling, numbness, weakness.  PSYCHIATRY: No anxiety or depression.   MEDICATIONS AT HOME:  Prior to Admission medications   Medication Sig Start Date End Date Taking? Authorizing Provider  aspirin EC 81 MG tablet Take 81 mg by mouth every evening.   Yes [provider]  Dimethyl Fumarate (TECFIDERA) 240 MG CPDR Take 240 mg by mouth 2 (two) times daily.   Yes [provider]  hydrochlorothiazide (MICROZIDE) 12.5 MG capsule Take 12.5 mg by mouth every evening.   Yes [provider]  levothyroxine (SYNTHROID, LEVOTHROID) 25 MCG tablet Take 25 mcg by mouth daily.    Yes [provider]  lisinopril (PRINIVIL,ZESTRIL) 40 MG tablet Take 40 mg by mouth  every evening.    Yes [provider]  pantoprazole (PROTONIX) 40 MG tablet Take 40 mg by mouth every evening.    Yes [provider]  simvastatin (ZOCOR) 40 MG tablet Take 40 mg by mouth every evening.    Yes [provider]  topiramate (TOPAMAX) 100 MG tablet Take 100 mg by mouth at bedtime.    Yes [provider]      PHYSICAL EXAMINATION:   VITAL SIGNS: Blood pressure (!)  127/97, pulse 63, temperature 98.3 F (36.8 C), temperature source Oral, resp. rate 17, height 5\' 9"  (1.753 m), weight 121.6 kg, SpO2 98 %.  GENERAL:  48 y.o.-year-old patient lying in the bed with no acute distress.  Morbidly obese EYES: Pupils equal, round, reactive to light and accommodation. No scleral icterus. Extraocular muscles intact.  HEENT: Head atraumatic, normocephalic. Oropharynx and nasopharynx clear.  NECK:  Supple, no jugular venous distention. No thyroid enlargement, no tenderness.  LUNGS: Normal breath sounds bilaterally, no wheezing, rales,rhonchi or crepitation. No use of accessory muscles of respiration.  CARDIOVASCULAR: S1, S2 normal. No murmurs, rubs, or gallops.  ABDOMEN: Soft, mild left-sided abdominal tenderness without rebound/guarding BS present. No organomegaly or mass.  EXTREMITIES: No pedal edema, cyanosis, or clubbing.  NEUROLOGIC: Cranial nerves II through XII are intact. Muscle strength 5/5 in all extremities. Sensation intact. Gait not checked.  PSYCHIATRIC: The patient is alert and oriented x 3.  SKIN: No obvious rash, lesion, or ulcer.   LABORATORY PANEL:   CBC Recent Labs  Lab 02/18/18 0758  WBC 9.8  HGB 16.8*  HCT 51.0*  PLT 376  MCV 85.4  MCH 28.1  MCHC 32.9  RDW 13.2   ------------------------------------------------------------------------------------------------------------------  Chemistries  Recent Labs  Lab 02/18/18 0758  NA 138  K 3.8  CL 106  CO2 21*  GLUCOSE 107*  BUN 21*  CREATININE 1.17*  CALCIUM 9.7   ------------------------------------------------------------------------------------------------------------------ estimated creatinine clearance is 82.1 mL/min (A) (by C-G formula based on SCr of 1.17 mg/dL (H)). ------------------------------------------------------------------------------------------------------------------ No results for input(s): TSH, T4TOTAL, T3FREE, THYROIDAB in the last 72 hours.  Invalid  input(s): FREET3   Coagulation profile No results for input(s): INR, PROTIME in the last 168 hours. ------------------------------------------------------------------------------------------------------------------- No results for input(s): DDIMER in the last 72 hours. -------------------------------------------------------------------------------------------------------------------  Cardiac Enzymes Recent Labs  Lab 02/18/18 0758  TROPONINI <0.03   ------------------------------------------------------------------------------------------------------------------ Invalid input(s): POCBNP  ---------------------------------------------------------------------------------------------------------------  Urinalysis    Component Value Date/Time   COLORURINE YELLOW (A) 06/24/2016 1117   APPEARANCEUR Clear 08/04/2017 1010   LABSPEC 1.015 06/24/2016 1117   PHURINE 5.0 06/24/2016 1117   GLUCOSEU Negative 08/04/2017 1010   HGBUR MODERATE (A) 06/24/2016 1117   BILIRUBINUR Negative 08/04/2017 1010   KETONESUR NEGATIVE 06/24/2016 1117   PROTEINUR Negative 08/04/2017 1010   PROTEINUR NEGATIVE 06/24/2016 1117   NITRITE Negative 08/04/2017 1010   NITRITE NEGATIVE 06/24/2016 1117   LEUKOCYTESUR Negative 08/04/2017 1010     RADIOLOGY: Dg Chest 2 View  Result Date: 02/18/2018 CLINICAL DATA:  Chest pain EXAM: CHEST - 2 VIEW COMPARISON:  January 17, 2016 FINDINGS: No edema or consolidation. The heart size and pulmonary vascularity are normal. No adenopathy. There is mild degenerative change in the thoracic spine. No pneumothorax. IMPRESSION: No edema or consolidation. Electronically Signed   By: Bretta Bang III M.D.   On: 02/18/2018 08:30    EKG: Orders placed or performed during the hospital encounter of 02/18/18  . EKG 12-Lead  . EKG 12-Lead    IMPRESSION AND  PLAN: Acute chest pain Admit to telemetry bed on our ACS protocol, continue to cycle cardiac enzymes, consult cardiology  for expert opinion, if rules out will proceed with nuclear medicine stress testing in the morning, continue aspirin, beta-blocker therapy, nitrates as needed, supplemental oxygen, IV morphine PRN breakthrough pain, check lipids in the morning, and continue close medical monitoring  *Acute abdominal pain Etiology unknown Check CT scan of the abdomen/pelvis for further evaluation  *Chronic GERD PPI daily  *Chronic hypothyroidism, unspecified Continue Synthroid  *Chronic benign essential hypertension Stable Continue home regiment  *Chronic extreme morbid obesity Most likely secondary to excess calories Lifestyle modification recommended   All the records are reviewed and case discussed with ED provider. Management plans discussed with the patient, family and they are in agreement.  CODE STATUS:full    TOTAL TIME TAKING CARE OF THIS PATIENT: 40 minutes.    Evelena Asa  M.D on 02/18/2018   Between 7am to 6pm - Pager - [phone]  After 6pm go to [email or web address] - password [email or web address]  [facility]  Office  [phone]  CC: Primary care physician; Alvina Filbert, MD   Note: This dictation was prepared with Dragon dictation along with smaller phrase technology. Any transcriptional errors that result from this process are unintentional.

## 2018-02-18 NOTE — ED Notes (Signed)
First attempt to call report. As per charge nurse on unit patient is no longer going into room 52. Patient will now be going into room 33 and the nurse for that room is not able to take report. Will call down when ready to take report.

## 2018-02-19 ENCOUNTER — Ambulatory Visit: Payer: Medicaid Other

## 2018-02-19 LAB — BASIC METABOLIC PANEL
Anion gap: 9 (ref 5–15)
BUN: 19 mg/dL (ref 6–20)
CO2: 22 mmol/L (ref 22–32)
Calcium: 9.7 mg/dL (ref 8.9–10.3)
Chloride: 106 mmol/L (ref 98–111)
Creatinine, Ser: 0.97 mg/dL (ref 0.44–1.00)
GFR calc Af Amer: >60 mL/min (ref >60)
GFR calc non Af Amer: >60 mL/min (ref >60)
Glucose, Bld: 100 mg/dL — ABNORMAL HIGH (ref 70–99)
Potassium: 3.8 mmol/L (ref 3.5–5.1)
Sodium: 137 mmol/L (ref 135–145)

## 2018-02-19 LAB — HIV ANTIBODY (ROUTINE TESTING W REFLEX): HIV Screen 4th Generation wRfx: NONREACTIVE

## 2018-02-19 MED ORDER — METOPROLOL TARTRATE 25 MG PO TABS: 25.0000 mg | ORAL_TABLET | Freq: Two times a day (BID) | ORAL | 0 refills | Status: DC

## 2018-02-19 MED ORDER — ASPIRIN 325 MG PO TBEC: 325.0000 mg | DELAYED_RELEASE_TABLET | Freq: Every day | ORAL | 0 refills | Status: DC

## 2018-02-19 NOTE — Discharge Summary (Signed)
[facility] [place] [facility]   PATIENT NAME: Katrina Murray    MR#:  [id number]  DATE OF BIRTH:  10/07/1970  DATE OF ADMISSION:  02/18/2018 ADMITTING PHYSICIAN: Bertrum Sol, MD  DATE OF DISCHARGE: No discharge date for patient encounter.  PRIMARY CARE PHYSICIAN: Alvina Filbert, MD    ADMISSION DIAGNOSIS:  Chest pain, unspecified type [R07.9]  DISCHARGE DIAGNOSIS:  Active Problems:   Chest pain   SECONDARY DIAGNOSIS:   Past Medical History:  Diagnosis Date  . Gastritis, chronic   . GERD (gastroesophageal reflux disease)   . Hypercholesteremia   . Hyperlipidemia   . Hypertension   . Hypothyroidism   . Multiple sclerosis [facility])     HOSPITAL COURSE:   Acute chest pain Resolved Referred to the observation unit, cardiac enzymes inconsistent with ACS, treated with increased dose of aspirin daily, Lopressor twice daily, lisinopril, statin therapy, provided nitrates/supplemental oxygen/IV morphine PRN, and patient did well On day of discharge patient elected to have outpatient cardiology evaluation/stress testing if needed, will arrange for status post discharge  *Acute abdominal pain Resolved Etiology unknown CT abdomen noted for hepatic steatosis  *Chronic GERD PPI daily  *Chronic hypothyroidism, unspecified Continued Synthroid  *Chronic benign essential hypertension Stable Continued home regiment  *Chronic extreme morbid obesity Most likely secondary to excess calories Lifestyle modification recommended   DISCHARGE CONDITIONS:   stable  CONSULTS OBTAINED:  Treatment Team:  Alwyn Pea, MD  DRUG ALLERGIES:  No Known Allergies  DISCHARGE MEDICATIONS:   Allergies as of 02/19/2018   No Known Allergies     Medication List    TAKE these medications   aspirin 325 MG EC tablet Take 1 tablet (325 mg total) by mouth daily. Start taking on:  February 20, 2018 What changed:    medication strength  how  much to take  when to take this   hydrochlorothiazide 12.5 MG capsule Commonly known as:  MICROZIDE Take 12.5 mg by mouth every evening.   levothyroxine 25 MCG tablet Commonly known as:  SYNTHROID, LEVOTHROID Take 25 mcg by mouth daily.   lisinopril 40 MG tablet Commonly known as:  PRINIVIL,ZESTRIL Take 40 mg by mouth every evening.   metoprolol tartrate 25 MG tablet Commonly known as:  LOPRESSOR Take 1 tablet (25 mg total) by mouth 2 (two) times daily.   pantoprazole 40 MG tablet Commonly known as:  PROTONIX Take 40 mg by mouth every evening.   simvastatin 40 MG tablet Commonly known as:  ZOCOR Take 40 mg by mouth every evening.   TECFIDERA 240 MG Cpdr Generic drug:  Dimethyl Fumarate Take 240 mg by mouth 2 (two) times daily.   topiramate 100 MG tablet Commonly known as:  TOPAMAX Take 100 mg by mouth at bedtime.        DISCHARGE INSTRUCTIONS:      If you experience worsening of your admission symptoms, develop shortness of breath, life threatening emergency, suicidal or homicidal thoughts you must seek medical attention immediately by calling 911 or calling your MD immediately  if symptoms less severe.  You Must read complete instructions/literature along with all the possible adverse reactions/side effects for all the Medicines you take and that have been prescribed to you. Take any new Medicines after you have completely understood and accept all the possible adverse reactions/side effects.   Please note  You were cared for by a hospitalist during your hospital stay. If you have any questions about your discharge medications or the  care you received while you were in the hospital after you are discharged, you can call the unit and asked to speak with the hospitalist on call if the hospitalist that took care of you is not available. Once you are discharged, your primary care physician will handle any further medical issues. Please note that NO REFILLS for any  discharge medications will be authorized once you are discharged, as it is imperative that you return to your primary care physician (or establish a relationship with a primary care physician if you do not have one) for your aftercare needs so that they can reassess your need for medications and monitor your lab values.    Today   CHIEF COMPLAINT:   Chief Complaint  Patient presents with  . Chest Pain    HISTORY OF PRESENT ILLNESS:  48 y.o. female with a known history per below presents emergency room with acute onset chest pain without radiation, denies any shortness of breath or diaphoresis, in the emergency room ER work-up was unimpressive, patient evaluated at the bedside in the emergency room, noted to be in mild discomfort, complaining of severe left-sided abdominal pain, denies any nausea/emesis, patient stating that she would like something to eat however, patient is now been admitted for acute chest pain and abdominal pain secondary to unknown etiology. VITAL SIGNS:  Blood pressure 103/69, pulse 66, temperature 97.8 F (36.6 C), temperature source Oral, resp. rate 19, height 5\' 9"  (1.753 m), weight 119 kg, SpO2 96 %.  I/O:    Intake/Output Summary (Last 24 hours) at 02/19/2018 1000 Last data filed at 02/19/2018 0232 Gross per 24 hour  Intake -  Output 600 ml  Net -600 ml    PHYSICAL EXAMINATION:  GENERAL:  48 y.o.-year-old patient lying in the bed with no acute distress.  EYES: Pupils equal, round, reactive to light and accommodation. No scleral icterus. Extraocular muscles intact.  HEENT: Head atraumatic, normocephalic. Oropharynx and nasopharynx clear.  NECK:  Supple, no jugular venous distention. No thyroid enlargement, no tenderness.  LUNGS: Normal breath sounds bilaterally, no wheezing, rales,rhonchi or crepitation. No use of accessory muscles of respiration.  CARDIOVASCULAR: S1, S2 normal. No murmurs, rubs, or gallops.  ABDOMEN: Soft, non-tender, non-distended. Bowel  sounds present. No organomegaly or mass.  EXTREMITIES: No pedal edema, cyanosis, or clubbing.  NEUROLOGIC: Cranial nerves II through XII are intact. Muscle strength 5/5 in all extremities. Sensation intact. Gait not checked.  PSYCHIATRIC: The patient is alert and oriented x 3.  SKIN: No obvious rash, lesion, or ulcer.   DATA REVIEW:   CBC Recent Labs  Lab 02/18/18 1412  WBC 9.9  HGB 15.5*  HCT 46.7*  PLT 325    Chemistries  Recent Labs  Lab 02/19/18 0628  NA 137  K 3.8  CL 106  CO2 22  GLUCOSE 100*  BUN 19  CREATININE 0.97  CALCIUM 9.7    Cardiac Enzymes Recent Labs  Lab 02/18/18 1953  TROPONINI <0.03    Microbiology Results  Results for orders placed or performed in visit on 08/04/17  Microscopic Examination     Status: Abnormal   Collection Time: 08/04/17 10:10 AM  Result Value Ref Range Status   WBC, UA None seen 0 - 5 /hpf Final   RBC, UA 0-2 0 - 2 /hpf Final   Epithelial Cells (non renal) 0-10 0 - 10 /hpf Final   Mucus, UA Present (A) Not Estab. Final   Bacteria, UA Moderate (A) None seen/Few Final  RADIOLOGY:  Dg Chest 2 View  Result Date: 02/18/2018 CLINICAL DATA:  Chest pain EXAM: CHEST - 2 VIEW COMPARISON:  January 17, 2016 FINDINGS: No edema or consolidation. The heart size and pulmonary vascularity are normal. No adenopathy. There is mild degenerative change in the thoracic spine. No pneumothorax. IMPRESSION: No edema or consolidation. Electronically Signed   By: Bretta BangWilliam  Woodruff III M.D.   On: 02/18/2018 08:30   Ct Abdomen Pelvis W Contrast  Result Date: 02/18/2018 CLINICAL DATA:  Left-sided abdominal pain. EXAM: CT ABDOMEN AND PELVIS WITH CONTRAST TECHNIQUE: Multidetector CT imaging of the abdomen and pelvis was performed using the standard protocol following bolus administration of intravenous contrast. CONTRAST:  100mL ISOVUE-300 IOPAMIDOL (ISOVUE-300) INJECTION 61% COMPARISON:  CT scan of Jun 24, 2016. FINDINGS: Lower chest: No acute  abnormality. Hepatobiliary: No gallstones or biliary dilatation is noted. Hepatic steatosis. Stable multiple hepatic cysts are noted. Pancreas: Unremarkable. No pancreatic ductal dilatation or surrounding inflammatory changes. Spleen: Normal in size without focal abnormality. Adrenals/Urinary Tract: Adrenal glands appear normal. Stable right renal cyst is noted. No hydronephrosis or renal obstruction is noted. No renal or ureteral calculi are noted. Urinary bladder is unremarkable. Stomach/Bowel: Stomach is within normal limits. Appendix appears normal. No evidence of bowel wall thickening, distention, or inflammatory changes. Vascular/Lymphatic: Aortic atherosclerosis. No enlarged abdominal or pelvic lymph nodes. Reproductive: Status post hysterectomy. No adnexal abnormality is noted. Stable vaginal or periurethral cyst is noted. Other: No abdominal wall hernia or abnormality. No abdominopelvic ascites. 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 No acute osseous abnormality is noted. IMPRESSION: No acute abnormality seen in the abdomen or pelvis. Stable hepatic steatosis.  Stable multiple hepatic cysts. Aortic Atherosclerosis (ICD10-I70.0). Electronically Signed   By: Lupita RaiderJames  Green Jr, M.D.   On: 02/18/2018 13:10    EKG:   Orders placed or performed during the hospital encounter of 02/18/18  . EKG 12-Lead  . EKG 12-Lead  . EKG 12-Lead (at 6am)  . EKG 12-Lead (at 6am)  . EKG 12-Lead  . EKG 12-Lead  . EKG 12-Lead  . EKG 12-Lead  . EKG 12-Lead  . EKG 12-Lead      Management plans discussed with the patient, family and they are in agreement.  CODE STATUS:     Code Status Orders  (From admission, onward)         Start     Ordered   02/18/18 1409  Full code  Continuous     02/18/18 1409        Code Status History    This patient has a current code status but no historical code status.      TOTAL TIME TAKING CARE OF THIS PATIENT:  40 minutes.    Evelena AsaMontell D  M.D on 02/19/2018 at 10:00 AM  Between 7am to 6pm - Pager - [phone]  After 6pm go to [email or web address] - password [email or web address]  [facility]  Office  [phone]  CC: Primary care physician; Alvina FilbertHunter, Denise, MD   Note: This dictation was prepared with Dragon dictation along with smaller phrase technology. Any transcriptional errors that result from this process are unintentional.

## 2018-02-19 NOTE — Plan of Care (Signed)
  Problem: Activity: Goal: Risk for activity intolerance will decrease Outcome: Progressing Note:  Up independently in room   Problem: Coping: Goal: Level of anxiety will decrease Outcome: Progressing   Problem: Elimination: Goal: Will not experience complications related to urinary retention Outcome: Progressing   Problem: Pain Managment: Goal: General experience of comfort will improve Outcome: Progressing Note:  No complaints of pain this shift   Problem: Safety: Goal: Ability to remain free from injury will improve Outcome: Progressing   Problem: Skin Integrity: Goal: Risk for impaired skin integrity will decrease Outcome: Progressing   Problem: Education: Goal: Knowledge of General Education information will improve Description Including pain rating scale, medication(s)/side effects and non-pharmacologic comfort measures Outcome: Completed/Met   Problem: Nutrition: Goal: Adequate nutrition will be maintained Outcome: Completed/Met

## 2018-03-02 ENCOUNTER — Other Ambulatory Visit: Payer: Self-pay | Admitting: Registered Nurse

## 2018-03-09 ENCOUNTER — Encounter: Payer: Self-pay | Admitting: Urology

## 2018-03-09 ENCOUNTER — Ambulatory Visit (INDEPENDENT_AMBULATORY_CARE_PROVIDER_SITE_OTHER): Payer: Medicaid Other | Admitting: Urology

## 2018-03-09 VITALS — BP 121/86 | HR 62 | Ht 69.0 in | Wt 262.0 lb

## 2018-03-09 DIAGNOSIS — N3946 Mixed incontinence: Secondary | ICD-10-CM | POA: Diagnosis not present

## 2018-03-09 MED ORDER — MIRABEGRON ER 50 MG PO TB24: 50.0000 mg | ORAL_TABLET | Freq: Every day | ORAL | 11 refills | Status: AC

## 2018-03-09 NOTE — Progress Notes (Signed)
03/09/2018 2:31 PM   Katrina Murray 08-04-1970 [id number]  Referring provider: Alvina Filbert, MD [address] [place], [place] [address]  Chief Complaint  Patient presents with  . Urinary Incontinence    HPI: Dictated along note 1 year ago.  The patient has a mild outlet abnormality and by history has urge incontinence and bedwetting and likely has a neurogenic bladder. 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 She does have a low pressure bladder. In my opinion she should not have a sling and would be at very high risk of long-term urinary retention. I urethral injectable would be a better option with the rare risk of her retention.   The patient had improved frequency and nocturia and incontinence on Vesicare as a partial responder.    Urge incontinence was better on Vesicare as a partial responder.  Clinically not infected.  Frequency stable.  She would rather not take medication but I mentioned that because of the multiple sclerosis there can be issues or contraindications for some of the refractory therapies and she is young for percutaneous tibial nerve stimulation's from an insurance standpoint and it may not be covered by Medicaid.  Last visit given Myrbetriq and to follow-up on Myrbetriq or Vesicare.  Patient was then told she had to fail oxybutynin in the solifenacin before pain for the Myrbetriq   Today She is failed oxybutynin with nighttime bedwetting wearing 2 pads.  She really thinks the Myrbetriq helped a lot.  She is failed solifenacin.  Clinically not infected.    PMH: Past Medical History:  Diagnosis Date  . Gastritis, chronic   . GERD (gastroesophageal reflux disease)   . Hypercholesteremia   . Hyperlipidemia   . Hypertension   . Hypothyroidism   . Multiple sclerosis [facility])     Surgical History: Past Surgical History:  Procedure Laterality Date  . ABDOMINAL  HYSTERECTOMY    . COLONOSCOPY WITH PROPOFOL N/A 01/29/2017   Procedure: COLONOSCOPY WITH PROPOFOL;  Surgeon: Toledo, Boykin Nearing, MD;  Location: [facility] ENDOSCOPY;  Service: Gastroenterology;  Laterality: N/A;  . ESOPHAGOGASTRODUODENOSCOPY (EGD) WITH PROPOFOL N/A 07/12/2016   Procedure: ESOPHAGOGASTRODUODENOSCOPY (EGD) WITH PROPOFOL;  Surgeon: Christena Deem, MD;  [facility]: [facility] ENDOSCOPY;  Service: Endoscopy;  Laterality: N/A;  . PARTIAL HYSTERECTOMY    . TUBAL LIGATION      Home Medications:  Allergies as of 03/09/2018   No Known Allergies     Medication List       Accurate as of March 09, 2018  2:31 PM. Always use your most recent med list.        aspirin 325 MG EC tablet Take 1 tablet (325 mg total) by mouth daily.   hydrochlorothiazide 12.5 MG capsule Commonly known as:  MICROZIDE Take 12.5 mg by mouth every evening.   levothyroxine 25 MCG tablet Commonly known as:  SYNTHROID, LEVOTHROID Take 25 mcg by mouth daily.   lisinopril 40 MG tablet Commonly known as:  PRINIVIL,ZESTRIL Take 40 mg by mouth every evening.   metoprolol tartrate 50 MG tablet Commonly known as:  LOPRESSOR USE AS DIRECTED, TAKE 1 TAB NIGHT BEFORE AND 2 TAB 90 MINUTES PRIOR TO CTA CORONARIES   pantoprazole 40 MG tablet Commonly known as:  PROTONIX Take 40 mg by mouth every evening.   simvastatin 40 MG tablet Commonly known as:  ZOCOR Take 40 mg by mouth every evening.   TECFIDERA 240 MG Cpdr Generic drug:  Dimethyl Fumarate Take 240 mg by mouth 2 (two) times daily.   topiramate 100 MG tablet Commonly known as:  TOPAMAX Take 100 mg by mouth at bedtime.       Allergies: No Known Allergies  Family History: Family History  Problem Relation Age of Onset  . Breast cancer Maternal Aunt   . Bladder Cancer Neg Hx   . Kidney cancer Neg Hx     Social History:  reports that she quit smoking about 12 years ago. She has never used smokeless tobacco. She reports that she does not drink  alcohol or use drugs.  ROS:                                        Physical Exam: There were no vitals taken for this visit.  Constitutional:  Alert and oriented, No acute distress. HEENT: [place] AT, moist mucus membranes.  Trachea midline, no masses. Cardiovascular: No clubbing, cyanosis, or edema.   Laboratory Data: Lab Results  Component Value Date   WBC 9.9 02/18/2018   HGB 15.5 (H) 02/18/2018   HCT 46.7 (H) 02/18/2018   MCV 84.6 02/18/2018   PLT 325 02/18/2018    Lab Results  Component Value Date   CREATININE 0.97 02/19/2018    No results found for: PSA  No results found for: TESTOSTERONE  No results found for: HGBA1C  Urinalysis    Component Value Date/Time   COLORURINE YELLOW (A) 06/24/2016 1117   APPEARANCEUR Clear 08/04/2017 1010   LABSPEC 1.015 06/24/2016 1117   PHURINE 5.0 06/24/2016 1117   GLUCOSEU Negative 08/04/2017 1010   HGBUR MODERATE (A) 06/24/2016 1117   BILIRUBINUR Negative 08/04/2017 1010   KETONESUR NEGATIVE 06/24/2016 1117   PROTEINUR Negative 08/04/2017 1010   PROTEINUR NEGATIVE 06/24/2016 1117   NITRITE Negative 08/04/2017 1010   NITRITE NEGATIVE 06/24/2016 1117   LEUKOCYTESUR Negative 08/04/2017 1010    Pertinent Imaging:   Assessment & Plan: It appears that Medicaid will cover it on step therapy.  We gave samples and prescription and reevaluate in 4 months  There are no diagnoses linked to this encounter.  No follow-ups on file.  Martina SinnerScott A Kamia Insalaco, MD  [facility] [address], [address] [place], [place] [id number] [phone]

## 2018-03-19 ENCOUNTER — Other Ambulatory Visit: Payer: Self-pay | Admitting: Internal Medicine

## 2018-03-19 DIAGNOSIS — Z1231 Encounter for screening mammogram for malignant neoplasm of breast: Secondary | ICD-10-CM

## 2018-03-23 ENCOUNTER — Ambulatory Visit
Admission: RE | Admit: 2018-03-23 | Discharge: 2018-03-23 | Disposition: A | Discharge status: Home / Self Care | Payer: Medicaid Other | Source: Ambulatory Visit | Attending: Internal Medicine | Admitting: Internal Medicine

## 2018-03-23 DIAGNOSIS — Z1231 Encounter for screening mammogram for malignant neoplasm of breast: Secondary | ICD-10-CM | POA: Diagnosis present

## 2018-04-02 ENCOUNTER — Telehealth: Payer: Self-pay | Admitting: Urology

## 2018-04-06 ENCOUNTER — Ambulatory Visit (INDEPENDENT_AMBULATORY_CARE_PROVIDER_SITE_OTHER): Payer: Medicaid Other | Admitting: Urology

## 2018-04-06 ENCOUNTER — Encounter: Payer: Self-pay | Admitting: Urology

## 2018-04-06 VITALS — BP 137/91 | HR 68 | Ht 69.0 in | Wt 262.0 lb

## 2018-04-06 DIAGNOSIS — N3946 Mixed incontinence: Secondary | ICD-10-CM

## 2018-04-06 NOTE — Progress Notes (Signed)
04/06/2018 10:12 AM   Katrina Murray 1970/08/02 [id number]  Referring provider: Alvina Filbert, MD [address] [place], [place] [address]  Chief Complaint  Patient presents with  . Medication Management    mixed incontinence    HPI: Dictated along note 1 year ago. The patient has a mild outlet abnormality and by history has urge incontinence and bedwetting and likely has a neurogenic bladder. 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 She does have a low pressure bladder. In my opinion she should not have a sling and would be at very high risk of long-term urinary retention. I urethral injectable would be a better option with the rare risk of her retention.  The patient had improved frequency and nocturia and incontinence on Vesicare as a partial responder.   Urge incontinence was better on Vesicare as a partial responder.She would rather not take medication but I mentioned that because of the multiple sclerosis there can be issues or contraindications for some of the refractory therapies and she is young for percutaneous tibial nerve stimulation's from an insurance standpoint and it may not be covered by Medicaid.  Last visit given Myrbetriq and to follow-up on Myrbetriq or Vesicare.  Patient was then told she had to fail oxybutynin in the solifenacin before paying for the Myrbetriq   She is failed oxybutynin with nighttime bedwetting wearing 2 pads.  She really thinks the Myrbetriq helped a lot.  She is failed solifenacin.  Clinically not infected.  It appears that Medicaid will cover the medication now on step therapy.  Today Apparently Medicaid will not pay for the medication and it makes her at least 50% better.  She still has stress incontinence but her primary problem is urge incontinence and bedwetting.  We talked about 3 refractory therapies in detail with my usual template.  Handouts given.   She understands she should not have a sling.  She understood the MRI issue with choices for the InterStim device.  The Medtronic newer device is coming out soon as well.  Clinically not infected and incontinence are stable   PMH: Past Medical History:  Diagnosis Date  . Gastritis, chronic   . GERD (gastroesophageal reflux disease)   . Hypercholesteremia   . Hyperlipidemia   . Hypertension   . Hypothyroidism   . Multiple sclerosis [facility])     Surgical History: Past Surgical History:  Procedure Laterality Date  . ABDOMINAL HYSTERECTOMY    . COLONOSCOPY WITH PROPOFOL N/A 01/29/2017   Procedure: COLONOSCOPY WITH PROPOFOL;  Surgeon: Toledo, Boykin Nearing, MD;  Location: [facility] ENDOSCOPY;  Service: Gastroenterology;  Laterality: N/A;  . ESOPHAGOGASTRODUODENOSCOPY (EGD) WITH PROPOFOL N/A 07/12/2016   Procedure: ESOPHAGOGASTRODUODENOSCOPY (EGD) WITH PROPOFOL;  Surgeon: Christena Deem, MD;  Location: [facility] ENDOSCOPY;  Service: Endoscopy;  Laterality: N/A;  . PARTIAL HYSTERECTOMY    . TUBAL LIGATION      Home Medications:  Allergies as of 04/06/2018   No Known Allergies     Medication List       Accurate as of April 06, 2018 10:12 AM. Always use your most recent med list.        aspirin 325 MG EC tablet Take 1 tablet (325 mg total) by mouth daily.   hydrochlorothiazide 12.5 MG capsule Commonly known as:  MICROZIDE Take 12.5 mg by mouth every evening.   levothyroxine 25 MCG tablet Commonly known as:  SYNTHROID, LEVOTHROID Take 25 mcg by mouth  daily.   lisinopril 40 MG tablet Commonly known as:  PRINIVIL,ZESTRIL Take 40 mg by mouth every evening.   metoprolol tartrate 50 MG tablet Commonly known as:  LOPRESSOR USE AS DIRECTED, TAKE 1 TAB NIGHT BEFORE AND 2 TAB 90 MINUTES PRIOR TO CTA CORONARIES   mirabegron ER 50 MG Tb24 tablet Commonly known as:  MYRBETRIQ Take 1 tablet (50 mg total) by mouth daily.   pantoprazole 40 MG tablet Commonly known as:  PROTONIX Take 40 mg  by mouth every evening.   simvastatin 40 MG tablet Commonly known as:  ZOCOR Take 40 mg by mouth every evening.   TECFIDERA 240 MG Cpdr Generic drug:  Dimethyl Fumarate Take 240 mg by mouth 2 (two) times daily.   topiramate 100 MG tablet Commonly known as:  TOPAMAX Take 100 mg by mouth at bedtime.       Allergies: No Known Allergies  Family History: Family History  Problem Relation Age of Onset  . Breast cancer Maternal Aunt   . Bladder Cancer Neg Hx   . Kidney cancer Neg Hx     Social History:  reports that she quit smoking about 12 years ago. She has never used smokeless tobacco. She reports that she does not drink alcohol or use drugs.  ROS: UROLOGY Frequent Urination?: Yes Hard to postpone urination?: No Burning/pain with urination?: No Get up at night to urinate?: No Leakage of urine?: Yes Urine stream starts and stops?: No Trouble starting stream?: No Do you have to strain to urinate?: No Blood in urine?: No Urinary tract infection?: No Sexually transmitted disease?: No Injury to kidneys or bladder?: No Painful intercourse?: No Weak stream?: Yes Currently pregnant?: No Vaginal bleeding?: No Last menstrual period?: n  Gastrointestinal Nausea?: No Vomiting?: No Indigestion/heartburn?: No Diarrhea?: No Constipation?: No  Constitutional Fever: No Night sweats?: Yes Weight loss?: No Fatigue?: No  Skin Skin rash/lesions?: No Itching?: No  Eyes Blurred vision?: Yes Double vision?: Yes  Ears/Nose/Throat Sore throat?: No Sinus problems?: No  Hematologic/Lymphatic Swollen glands?: No Easy bruising?: Yes  Cardiovascular Leg swelling?: No Chest pain?: No  Respiratory Cough?: No Shortness of breath?: No  Endocrine Excessive thirst?: Yes  Musculoskeletal Back pain?: Yes Joint pain?: Yes  Neurological Headaches?: Yes Dizziness?: No  Psychologic Depression?: No Anxiety?: No  Physical Exam: BP (!) 137/91 (BP Location: Left  Arm, Patient Position: Sitting)   Pulse 68   Ht 5\' 9"  (1.753 m)   Wt 262 lb (118.8 kg)   BMI 38.69 kg/m     Laboratory Data: Lab Results  Component Value Date   WBC 9.9 02/18/2018   HGB 15.5 (H) 02/18/2018   HCT 46.7 (H) 02/18/2018   MCV 84.6 02/18/2018   PLT 325 02/18/2018    Lab Results  Component Value Date   CREATININE 0.97 02/19/2018    No results found for: PSA  No results found for: TESTOSTERONE  No results found for: HGBA1C  Urinalysis    Component Value Date/Time   COLORURINE YELLOW (A) 06/24/2016 1117   APPEARANCEUR Clear 08/04/2017 1010   LABSPEC 1.015 06/24/2016 1117   PHURINE 5.0 06/24/2016 1117   GLUCOSEU Negative 08/04/2017 1010   HGBUR MODERATE (A) 06/24/2016 1117   BILIRUBINUR Negative 08/04/2017 1010   KETONESUR NEGATIVE 06/24/2016 1117   PROTEINUR Negative 08/04/2017 1010   PROTEINUR NEGATIVE 06/24/2016 1117   NITRITE Negative 08/04/2017 1010   NITRITE NEGATIVE 06/24/2016 1117   LEUKOCYTESUR Negative 08/04/2017 1010    Pertinent Imaging:   Assessment &  Plan: The patient was hoping to become dry.  She was upset today with the imperfect options.  I certainly can understand things for her.  I gave her 3 months of samples and reassess in 10 weeks.  3 handouts given.  We will proceed accordingly.  There are no diagnoses linked to this encounter.  No follow-ups on file.  Martina Sinner, MD  [facility] [address], [address] [place], [place] [address] [phone]

## 2018-05-05 NOTE — Telephone Encounter (Signed)
ERROR

## 2018-06-15 ENCOUNTER — Ambulatory Visit: Payer: Medicaid Other | Admitting: Urology

## 2018-07-13 ENCOUNTER — Ambulatory Visit: Payer: Medicaid Other | Admitting: Urology

## 2018-07-13 ENCOUNTER — Encounter: Payer: Self-pay | Admitting: Urology

## 2018-07-21 ENCOUNTER — Telehealth: Payer: Self-pay | Admitting: Urology

## 2018-07-21 NOTE — Telephone Encounter (Signed)
Pt called office about missed appt on 6/1 and rescheduled w/MacDiarmid on 7/13.  She would like some more samples of Myrbetriq to last until her appt date.  I looked in last office note and didn't see mg.  Please call pt and let her know if she can get more.

## 2018-07-21 NOTE — Telephone Encounter (Signed)
Patient notified samples up front

## 2018-08-03 ENCOUNTER — Ambulatory Visit: Payer: Medicaid Other | Admitting: Urology

## 2018-08-24 ENCOUNTER — Encounter: Payer: Self-pay | Admitting: Urology

## 2018-08-24 ENCOUNTER — Ambulatory Visit (INDEPENDENT_AMBULATORY_CARE_PROVIDER_SITE_OTHER): Payer: Medicaid Other | Admitting: Urology

## 2018-08-24 ENCOUNTER — Other Ambulatory Visit: Payer: Self-pay

## 2018-08-24 VITALS — BP 171/124 | HR 77 | Ht 69.0 in | Wt 275.0 lb

## 2018-08-24 DIAGNOSIS — N3946 Mixed incontinence: Secondary | ICD-10-CM | POA: Diagnosis not present

## 2018-08-24 NOTE — Progress Notes (Signed)
08/24/2018 9:34 AM   Katrina Murray 11/15/1970 [id number]  Referring provider: Abran Richard, MD [address] [place],  [place] [address]  Chief Complaint  Patient presents with  . Urinary Incontinence    follow up 4 month    HPI: Dictated along note 1 year ago. The patient has a mild outlet abnormality and by history has urge incontinence and bedwetting and likely has a neurogenic bladder. 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 She does have a low pressure bladder. In my opinion she should not have a sling and would be at very high risk of long-term urinary retention. I urethral injectable would be a better option with the rare risk of her retention.  The patient had improved frequency and nocturia and incontinence on Vesicare as a partial responder.   Urge incontinence was better on Vesicare as a partial responder.She would rather not take medication but I mentioned that because of the multiple sclerosis there can be issues or contraindications for some of the refractory therapies and she is young for percutaneous tibial nerve stimulation's from an insurance standpoint and it may not be covered by Medicaid.Last visit given Myrbetriq and to follow-up on Myrbetriq or Vesicare.Patient was then told she had to fail oxybutynin in the solifenacin before paying for the Myrbetriq   She is failed oxybutynin with nighttime bedwetting wearing 2 pads. She really thinks the Myrbetriq helped a lot. She is failed solifenacin. Clinically not infected.  It appears that Medicaid will cover the medication now on step therapy.  Apparently Medicaid will not pay for the medication and it makes her at least 50% better.  She still has stress incontinence but her primary problem is urge incontinence and bedwetting.  We talked about 3 refractory therapies in detail with my usual template.  Handouts given.  She  understands she should not have a sling.  She understood the MRI issue with choices for the InterStim device.  The Medtronic newer device is coming out soon as well.  The patient was hoping to become dry.  She was upset today with the imperfect options.  I certainly can understand things for her.  I gave her 3 months of samples and reassess in 10 weeks.  3 handouts given.  We will proceed accordingly.  Day Incontinence improved on Myrbetriq but she said Medicaid does not cover it.  She is chose watchful waiting.  She deftly does not want Botox.  She does not want InterStim at this stage.  Her fianc is in the hospital.  Clinically not infected today   PMH: Past Medical History:  Diagnosis Date  . Gastritis, chronic   . GERD (gastroesophageal reflux disease)   . Hypercholesteremia   . Hyperlipidemia   . Hypertension   . Hypothyroidism   . Multiple sclerosis [facility])     Surgical History: Past Surgical History:  Procedure Laterality Date  . ABDOMINAL HYSTERECTOMY    . COLONOSCOPY WITH PROPOFOL N/A 01/29/2017   Procedure: COLONOSCOPY WITH PROPOFOL;  Surgeon: Toledo, Benay Pike, MD;  Location: [facility] ENDOSCOPY;  Service: Gastroenterology;  Laterality: N/A;  . ESOPHAGOGASTRODUODENOSCOPY (EGD) WITH PROPOFOL N/A 07/12/2016   Procedure: ESOPHAGOGASTRODUODENOSCOPY (EGD) WITH PROPOFOL;  Surgeon: Lollie Sails, MD;  Location: [facility] ENDOSCOPY;  Service: Endoscopy;  Laterality: N/A;  . PARTIAL HYSTERECTOMY    . TUBAL LIGATION      Home Medications:  Allergies as of 08/24/2018   No Known Allergies  Medication List       Accurate as of August 24, 2018  9:34 AM. If you have any questions, ask your nurse or doctor.        aspirin 325 MG EC tablet Take 1 tablet (325 mg total) by mouth daily.   hydrochlorothiazide 12.5 MG capsule Commonly known as: MICROZIDE Take 12.5 mg by mouth every evening.   levothyroxine 25 MCG tablet Commonly known as: SYNTHROID Take 25 mcg by mouth daily.    lisinopril 40 MG tablet Commonly known as: ZESTRIL Take 40 mg by mouth every evening.   metoprolol tartrate 50 MG tablet Commonly known as: LOPRESSOR USE AS DIRECTED, TAKE 1 TAB NIGHT BEFORE AND 2 TAB 90 MINUTES PRIOR TO CTA CORONARIES   mirabegron ER 50 MG Tb24 tablet Commonly known as: MYRBETRIQ Take 1 tablet (50 mg total) by mouth daily.   pantoprazole 40 MG tablet Commonly known as: PROTONIX Take 40 mg by mouth every evening.   simvastatin 40 MG tablet Commonly known as: ZOCOR Take 40 mg by mouth every evening.   Tecfidera 240 MG Cpdr Generic drug: Dimethyl Fumarate Take 240 mg by mouth 2 (two) times daily.   topiramate 100 MG tablet Commonly known as: TOPAMAX Take 100 mg by mouth at bedtime.       Allergies: No Known Allergies  Family History: Family History  Problem Relation Age of Onset  . Breast cancer Maternal Aunt   . Bladder Cancer Neg Hx   . Kidney cancer Neg Hx     Social History:  reports that she quit smoking about 12 years ago. She has never used smokeless tobacco. She reports that she does not drink alcohol or use drugs.  ROS:                                        Physical Exam: BP (!) 171/124   Pulse 77   Ht 5\' 9"  (1.753 m)   Wt 275 lb (124.7 kg)   BMI 40.61 kg/m   Constitutional:  Alert and oriented, No acute distress.  Laboratory Data: Lab Results  Component Value Date   WBC 9.9 02/18/2018   HGB 15.5 (H) 02/18/2018   HCT 46.7 (H) 02/18/2018   MCV 84.6 02/18/2018   PLT 325 02/18/2018    Lab Results  Component Value Date   CREATININE 0.97 02/19/2018    No results found for: PSA  No results found for: TESTOSTERONE  No results found for: HGBA1C  Urinalysis    Component Value Date/Time   COLORURINE YELLOW (A) 06/24/2016 1117   APPEARANCEUR Clear 08/04/2017 1010   LABSPEC 1.015 06/24/2016 1117   PHURINE 5.0 06/24/2016 1117   GLUCOSEU Negative 08/04/2017 1010   HGBUR MODERATE (A) 06/24/2016 1117    BILIRUBINUR Negative 08/04/2017 1010   KETONESUR NEGATIVE 06/24/2016 1117   PROTEINUR Negative 08/04/2017 1010   PROTEINUR NEGATIVE 06/24/2016 1117   NITRITE Negative 08/04/2017 1010   NITRITE NEGATIVE 06/24/2016 1117   LEUKOCYTESUR Negative 08/04/2017 1010    Pertinent Imaging:   Assessment & Plan: Assess PRN give another month of samples  There are no diagnoses linked to this encounter.  No follow-ups on file.  Martina Sinner, MD  [facility] [address] [place], [place] [address] [phone]

## 2018-08-30 ENCOUNTER — Encounter: Payer: Self-pay | Admitting: Emergency Medicine

## 2018-08-30 ENCOUNTER — Emergency Department: Payer: Medicaid Other

## 2018-08-30 ENCOUNTER — Emergency Department
Admission: EM | Admit: 2018-08-30 | Discharge: 2018-08-30 | Disposition: A | Discharge status: Home / Self Care | Payer: Medicaid Other | Source: Home / Self Care | Attending: Emergency Medicine | Admitting: Emergency Medicine

## 2018-08-30 ENCOUNTER — Other Ambulatory Visit: Payer: Self-pay

## 2018-08-30 DIAGNOSIS — Z87891 Personal history of nicotine dependence: Secondary | ICD-10-CM | POA: Insufficient documentation

## 2018-08-30 DIAGNOSIS — I10 Essential (primary) hypertension: Secondary | ICD-10-CM | POA: Insufficient documentation

## 2018-08-30 DIAGNOSIS — S0502XA Injury of conjunctiva and corneal abrasion without foreign body, left eye, initial encounter: Secondary | ICD-10-CM | POA: Insufficient documentation

## 2018-08-30 DIAGNOSIS — Y929 Unspecified place or not applicable: Secondary | ICD-10-CM | POA: Insufficient documentation

## 2018-08-30 DIAGNOSIS — G35 Multiple sclerosis: Secondary | ICD-10-CM | POA: Insufficient documentation

## 2018-08-30 DIAGNOSIS — L03213 Periorbital cellulitis: Secondary | ICD-10-CM | POA: Insufficient documentation

## 2018-08-30 DIAGNOSIS — Z79899 Other long term (current) drug therapy: Secondary | ICD-10-CM | POA: Insufficient documentation

## 2018-08-30 DIAGNOSIS — Y999 Unspecified external cause status: Secondary | ICD-10-CM | POA: Insufficient documentation

## 2018-08-30 DIAGNOSIS — Y93E8 Activity, other personal hygiene: Secondary | ICD-10-CM | POA: Insufficient documentation

## 2018-08-30 DIAGNOSIS — E039 Hypothyroidism, unspecified: Secondary | ICD-10-CM | POA: Insufficient documentation

## 2018-08-30 DIAGNOSIS — Y33XXXA Other specified events, undetermined intent, initial encounter: Secondary | ICD-10-CM | POA: Insufficient documentation

## 2018-08-30 LAB — COMPREHENSIVE METABOLIC PANEL
ALT: 20 U/L (ref 0–44)
AST: 20 U/L (ref 15–41)
Albumin: 4.6 g/dL (ref 3.5–5.0)
Alkaline Phosphatase: 44 U/L (ref 38–126)
Anion gap: 10 (ref 5–15)
BUN: 15 mg/dL (ref 6–20)
CO2: 22 mmol/L (ref 22–32)
Calcium: 9.1 mg/dL (ref 8.9–10.3)
Chloride: 107 mmol/L (ref 98–111)
Creatinine, Ser: 0.96 mg/dL (ref 0.44–1.00)
GFR calc Af Amer: >60 mL/min (ref >60)
GFR calc non Af Amer: >60 mL/min (ref >60)
Glucose, Bld: 115 mg/dL — ABNORMAL HIGH (ref 70–99)
Potassium: 4.1 mmol/L (ref 3.5–5.1)
Sodium: 139 mmol/L (ref 135–145)
Total Bilirubin: 0.3 mg/dL (ref 0.3–1.2)
Total Protein: 7.5 g/dL (ref 6.5–8.1)

## 2018-08-30 LAB — CBC WITH DIFFERENTIAL/PLATELET
Abs Immature Granulocytes: 0.03 10*3/uL (ref 0.00–0.07)
Basophils Absolute: 0.1 10*3/uL (ref 0.0–0.1)
Basophils Relative: 1 %
Eosinophils Absolute: 0.3 10*3/uL (ref 0.0–0.5)
Eosinophils Relative: 4 %
HCT: 46.3 % — ABNORMAL HIGH (ref 36.0–46.0)
Hemoglobin: 15.1 g/dL — ABNORMAL HIGH (ref 12.0–15.0)
Immature Granulocytes: 0 %
Lymphocytes Relative: 21 %
Lymphs Abs: 1.7 10*3/uL (ref 0.7–4.0)
MCH: 27.5 pg (ref 26.0–34.0)
MCHC: 32.6 g/dL (ref 30.0–36.0)
MCV: 84.2 fL (ref 80.0–100.0)
Monocytes Absolute: 0.7 10*3/uL (ref 0.1–1.0)
Monocytes Relative: 8 %
Neutro Abs: 5.4 10*3/uL (ref 1.7–7.7)
Neutrophils Relative %: 66 %
Platelets: 296 10*3/uL (ref 150–400)
RBC: 5.5 MIL/uL — ABNORMAL HIGH (ref 3.87–5.11)
RDW: 13.2 % (ref 11.5–15.5)
WBC: 8.2 10*3/uL (ref 4.0–10.5)
nRBC: 0 % (ref 0.0–0.2)

## 2018-08-30 MED ORDER — ERYTHROMYCIN 5 MG/GM OP OINT: 1.0000 "application " | TOPICAL_OINTMENT | Freq: Every day | OPHTHALMIC | 0 refills | Status: AC

## 2018-08-30 MED ORDER — SODIUM CHLORIDE 0.9 % IV SOLN
1.0000 g | Freq: Once | INTRAVENOUS | Status: AC
  Administered 2018-08-30: 1 g via INTRAVENOUS
  Filled 2018-08-30: qty 10

## 2018-08-30 MED ORDER — TETRACAINE HCL 0.5 % OP SOLN
2.0000 [drp] | Freq: Once | OPHTHALMIC | Status: AC
  Administered 2018-08-30: 15:00:00 2 [drp] via OPHTHALMIC
  Filled 2018-08-30: qty 4

## 2018-08-30 MED ORDER — TRAMADOL HCL 50 MG PO TABS: 50.0000 mg | ORAL_TABLET | Freq: Four times a day (QID) | ORAL | 0 refills | Status: AC | PRN

## 2018-08-30 MED ORDER — MORPHINE SULFATE (PF) 2 MG/ML IV SOLN
2.0000 mg | Freq: Once | INTRAVENOUS | Status: AC
  Administered 2018-08-30: 2 mg via INTRAVENOUS
  Filled 2018-08-30: qty 1

## 2018-08-30 MED ORDER — SULFAMETHOXAZOLE-TRIMETHOPRIM 800-160 MG PO TABS: 1.0000 | ORAL_TABLET | Freq: Two times a day (BID) | ORAL | 0 refills | Status: DC

## 2018-08-30 MED ORDER — CEFDINIR 300 MG PO CAPS: 300.0000 mg | ORAL_CAPSULE | Freq: Two times a day (BID) | ORAL | 0 refills | Status: DC

## 2018-08-30 MED ORDER — FLUORESCEIN SODIUM 1 MG OP STRP
1.0000 | ORAL_STRIP | Freq: Once | OPHTHALMIC | Status: AC
  Administered 2018-08-30: 1 via OPHTHALMIC

## 2018-08-30 MED ORDER — FLUORESCEIN SODIUM 1 MG OP STRP
ORAL_STRIP | OPHTHALMIC | Status: AC
  Administered 2018-08-30: 1 via OPHTHALMIC
  Filled 2018-08-30: qty 1

## 2018-08-30 MED ORDER — IOHEXOL 300 MG/ML  SOLN
75.0000 mL | Freq: Once | INTRAMUSCULAR | Status: AC | PRN
  Administered 2018-08-30: 75 mL via INTRAVENOUS
  Filled 2018-08-30: qty 75

## 2018-08-30 NOTE — ED Notes (Signed)

## 2018-08-30 NOTE — ED Provider Notes (Signed)
[facility] Emergency Department Provider Note    First MD Initiated Contact with Patient 08/30/18 1352     (approximate)  I have reviewed the triage vital signs and the nursing notes.   HISTORY  Chief Complaint Facial Swelling   HPI Katrina Murray is a 48 y.o. female with below list of previous medical conditions presents to the emergency department secondary to left facial swelling and redness that began around the eye and extended across and down the face.  Patient states this all began after she "pulled out an eyelash".  Patient admits to tenderness to the area current discomfort 8 out of 10.  Patient denies any fever afebrile on presentation.  Patient states that light aggravates the pain in her left eye.Marland Kitchen        Past Medical History:  Diagnosis Date  . Gastritis, chronic   . GERD (gastroesophageal reflux disease)   . Hypercholesteremia   . Hyperlipidemia   . Hypertension   . Hypothyroidism   . Multiple sclerosis [facility])     Patient Active Problem List   Diagnosis Date Noted  . Chest pain 02/18/2018    Past Surgical History:  Procedure Laterality Date  . ABDOMINAL HYSTERECTOMY    . COLONOSCOPY WITH PROPOFOL N/A 01/29/2017   Procedure: COLONOSCOPY WITH PROPOFOL;  Surgeon: Toledo, Benay Pike, MD;  Location: [facility] ENDOSCOPY;  Service: Gastroenterology;  Laterality: N/A;  . ESOPHAGOGASTRODUODENOSCOPY (EGD) WITH PROPOFOL N/A 07/12/2016   Procedure: ESOPHAGOGASTRODUODENOSCOPY (EGD) WITH PROPOFOL;  Surgeon: Lollie Sails, MD;  Location: [facility] ENDOSCOPY;  Service: Endoscopy;  Laterality: N/A;  . PARTIAL HYSTERECTOMY    . TUBAL LIGATION      Prior to Admission medications   Medication Sig Start Date End Date Taking? Authorizing Provider  cefdinir (OMNICEF) 300 MG capsule Take 1 capsule (300 mg total) by mouth 2 (two) times daily for 10 days. 08/30/18 09/09/18  Gregor Hams, MD  Dimethyl Fumarate (TECFIDERA) 240 MG CPDR Take 240 mg by mouth  2 (two) times daily.    [provider]  erythromycin ophthalmic ointment Place 1 application into the left eye at bedtime for 7 days. 08/30/18 09/06/18  Gregor Hams, MD  hydrochlorothiazide (MICROZIDE) 12.5 MG capsule Take 12.5 mg by mouth every evening.    [provider]  levothyroxine (SYNTHROID, LEVOTHROID) 25 MCG tablet Take 25 mcg by mouth daily.     [provider]  lisinopril (PRINIVIL,ZESTRIL) 40 MG tablet Take 40 mg by mouth every evening.     [provider]  metoprolol tartrate (LOPRESSOR) 50 MG tablet USE AS DIRECTED, TAKE 1 TAB NIGHT BEFORE AND 2 TAB 90 MINUTES PRIOR TO CTA CORONARIES 02/23/18   [provider]  mirabegron ER (MYRBETRIQ) 50 MG TB24 tablet Take 1 tablet (50 mg total) by mouth daily. 03/09/18   Bjorn Loser, MD  pantoprazole (PROTONIX) 40 MG tablet Take 40 mg by mouth every evening.     [provider]  simvastatin (ZOCOR) 40 MG tablet Take 40 mg by mouth every evening.     [provider]  sulfamethoxazole-trimethoprim (BACTRIM DS) 800-160 MG tablet Take 1 tablet by mouth 2 (two) times daily for 10 days. 08/30/18 09/09/18  Gregor Hams, MD  topiramate (TOPAMAX) 100 MG tablet Take 100 mg by mouth at bedtime.     [provider]  traMADol (ULTRAM) 50 MG tablet Take 1 tablet (50 mg total) by mouth every 6 (six) hours as needed. 08/30/18 08/30/19  Marjean Donna  N, MD    Allergies Patient has no known allergies.  Family History  Problem Relation Age of Onset  . Breast cancer Maternal Aunt   . Bladder Cancer Neg Hx   . Kidney cancer Neg Hx     Social History Social History   Tobacco Use  . Smoking status: Former Smoker    Quit date: 2008    Years since quitting: 12.5  . Smokeless tobacco: Never Used  Substance Use Topics  . Alcohol use: No    Comment: occasional  . Drug use: No    Review of Systems Constitutional: No fever/chills Eyes: No visual changes.  Positive for  left periorbital swelling and redness.  Positive for photophobia ENT: No sore throat. Cardiovascular: Denies chest pain. Respiratory: Denies shortness of breath. Gastrointestinal: No abdominal pain.  No nausea, no vomiting.  No diarrhea.  No constipation. Genitourinary: Negative for dysuria. Musculoskeletal: Negative for neck pain.  Negative for back pain. Integumentary: Negative for rash. Neurological: Negative for headaches, focal weakness or numbness.   ____________________________________________   PHYSICAL EXAM:  VITAL SIGNS: ED Triage Vitals [08/30/18 1301]  Enc Vitals Group     BP (!) 147/95     Pulse Rate 74     Resp 18     Temp 98 F (36.7 C)     Temp Source Oral     SpO2 96 %     Weight 113.4 kg (250 lb)     Height 1.753 m (5\' 9" )     Head Circumference      Peak Flow      Pain Score 9     Pain Loc      Pain Edu?      Excl. in GC?     Constitutional: Alert and oriented. Well appearing and in no acute distress. Eyes: Blanching erythema around the left eye with associated swelling extending to the nasal bridge and down to the cheek.  No pain with extraocular motor function.  Corneal abrasion noted with fluorescein staining Mouth/Throat: Mucous membranes are moist.  Oropharynx non-erythematous. Neck: No stridor.   Cardiovascular: Normal rate, regular rhythm. Good peripheral circulation. Grossly normal heart sounds. Respiratory: Normal respiratory effort.  No retractions. No audible wheezing. Musculoskeletal: No lower extremity tenderness nor edema. No gross deformities of extremities. Neurologic:  Normal speech and language. No gross focal neurologic deficits are appreciated.  Skin:  Skin is warm, dry and intact. No rash noted. Psychiatric: Mood and affect are normal. Speech and behavior are normal.  ____________________________________________   LABS (all labs ordered are listed, but only abnormal results are displayed)  Labs Reviewed  CBC WITH  DIFFERENTIAL/PLATELET - Abnormal; Notable for the following components:      Result Value   RBC 5.50 (*)    Hemoglobin 15.1 (*)    HCT 46.3 (*)    All other components within normal limits  COMPREHENSIVE METABOLIC PANEL - Abnormal; Notable for the following components:   Glucose, Bld 115 (*)    All other components within normal limits   ____________________________________________  _____  RADIOLOGY I, Darci CurrentANDOLPH N Ruchi Stoney, personally viewed and evaluated these images (plain radiographs) as part of my medical decision making, as well as reviewing the written report by the radiologist.  ED MD interpretation: Left periorbital soft tissue swelling consistent with cellulitis no post septal cellulitis or abscess noted per radiologist on CT or early  Official radiology report(s): Ct Orbits W Contrast  Result Date: 08/30/2018 CLINICAL DATA:  Left periorbital cellulitis. Left  eye swelling and pain with photophobia. EXAM: CT ORBITS WITH CONTRAST TECHNIQUE: Multidetector CT images was performed according to the standard protocol following intravenous contrast administration. CONTRAST:  75mL OMNIPAQUE IOHEXOL 300 MG/ML  SOLN COMPARISON:  Brain MRI 04/05/2010 FINDINGS: Orbits: Mild left periorbital soft tissue swelling without abscess. No evidence of postseptal inflammation. Intact globes. No mass or fracture. Visualized sinuses: Paranasal sinuses and mastoid air cells are clear. Soft tissues: Left periorbital swelling mildly extends into the pre maxillary soft tissues. Limited intracranial: Cerebral white matter hypodensities most notable in the left centrum semiovale corresponding to T2/FLAIR hyperintense lesions on the prior MRI in this patient with a history of multiple sclerosis. IMPRESSION: Left periorbital soft tissue swelling compatible with cellulitis. No evidence of postseptal cellulitis or abscess. Electronically Signed   By: Sebastian AcheAllen  Grady M.D.   On: 08/30/2018 15:25     ____________________________________________    Procedures   ____________________________________________   INITIAL IMPRESSION / MDM / ASSESSMENT AND PLAN / ED COURSE  As part of my medical decision making, I reviewed the following data within the electronic MEDICAL RECORD NUMBER   48 year old female presenting with above-stated history and physical exam consistent with left periorbital cellulitis.  CT scan of the orbits revealed no orbital involvement.  Patient given IV ceftriaxone 1 g in the emergency department.  Patient will be prescription prescribed Bactrim and cefdinir for home as well as tramadol for pain.  Patient advised of warning signs that would warrant immediate return to the emergency department.  _____________________________________  FINAL CLINICAL IMPRESSION(S) / ED DIAGNOSES  Final diagnoses:  Preseptal cellulitis  Abrasion of left cornea, initial encounter     MEDICATIONS GIVEN DURING THIS VISIT:  Medications  morphine 2 MG/ML injection 2 mg (2 mg Intravenous Given 08/30/18 1433)  cefTRIAXone (ROCEPHIN) 1 g in sodium chloride 0.9 % 100 mL IVPB (0 g Intravenous Stopped 08/30/18 1504)  tetracaine (PONTOCAINE) 0.5 % ophthalmic solution 2 drop (2 drops Left Eye Given by Other 08/30/18 1450)  iohexol (OMNIPAQUE) 300 MG/ML solution 75 mL (75 mLs Intravenous Contrast Given 08/30/18 1459)  fluorescein ophthalmic strip 1 strip (1 strip Left Eye Given 08/30/18 1451)     ED Discharge Orders         Ordered    traMADol (ULTRAM) 50 MG tablet  Every 6 hours PRN     08/30/18 1533    sulfamethoxazole-trimethoprim (BACTRIM DS) 800-160 MG tablet  2 times daily     08/30/18 1533    cefdinir (OMNICEF) 300 MG capsule  2 times daily     08/30/18 1533    erythromycin ophthalmic ointment  Daily at bedtime     08/30/18 1552          *Please note:  Katrina Murray was evaluated in Emergency Department on 08/30/2018 for the symptoms described in the history of present illness. She  was evaluated in the context of the global COVID-19 pandemic, which necessitated consideration that the patient might be at risk for infection with the SARS-CoV-2 virus that causes COVID-19. Institutional protocols and algorithms that pertain to the evaluation of patients at risk for COVID-19 are in a state of rapid change based on information released by regulatory bodies including the CDC and federal and state organizations. These policies and algorithms were followed during the patient's care in the ED.  Some ED evaluations and interventions may be delayed as a result of limited staffing during the pandemic.*  Note:  This document was prepared using Conservation officer, historic buildingsDragon voice recognition software  and may include unintentional dictation errors.   Darci Current, MD 08/30/18 [phone]

## 2018-08-30 NOTE — ED Notes (Signed)
Spoke with Dr. Joni Fears regarding patient care.

## 2018-08-30 NOTE — ED Triage Notes (Signed)
Pt presents to ED via POV with c/o swelling to L eye that is extending over bridge of nose to R eye. Pt states last night pulled eye last out of eye, pt states severe tenderness to touch at this time, pt also c/o photophobia at this time.

## 2018-08-30 NOTE — ED Notes (Signed)
Woods lamp to bedside 

## 2018-08-31 ENCOUNTER — Inpatient Hospital Stay
Admission: EM | Admit: 2018-08-31 | Discharge: 2018-09-03 | DRG: 603 | Disposition: A | Discharge status: Home / Self Care | Payer: Medicaid Other | Attending: Internal Medicine | Admitting: Internal Medicine

## 2018-08-31 ENCOUNTER — Other Ambulatory Visit: Payer: Self-pay

## 2018-08-31 DIAGNOSIS — L03211 Cellulitis of face: Secondary | ICD-10-CM | POA: Diagnosis present

## 2018-08-31 DIAGNOSIS — Z8249 Family history of ischemic heart disease and other diseases of the circulatory system: Secondary | ICD-10-CM | POA: Diagnosis not present

## 2018-08-31 DIAGNOSIS — T783XXA Angioneurotic edema, initial encounter: Secondary | ICD-10-CM | POA: Diagnosis present

## 2018-08-31 DIAGNOSIS — T368X5A Adverse effect of other systemic antibiotics, initial encounter: Secondary | ICD-10-CM | POA: Diagnosis present

## 2018-08-31 DIAGNOSIS — Z6838 Body mass index (BMI) 38.0-38.9, adult: Secondary | ICD-10-CM | POA: Diagnosis not present

## 2018-08-31 DIAGNOSIS — Z1159 Encounter for screening for other viral diseases: Secondary | ICD-10-CM | POA: Diagnosis not present

## 2018-08-31 DIAGNOSIS — E669 Obesity, unspecified: Secondary | ICD-10-CM | POA: Diagnosis present

## 2018-08-31 DIAGNOSIS — Z87891 Personal history of nicotine dependence: Secondary | ICD-10-CM | POA: Diagnosis not present

## 2018-08-31 DIAGNOSIS — L03213 Periorbital cellulitis: Principal | ICD-10-CM | POA: Diagnosis present

## 2018-08-31 DIAGNOSIS — E039 Hypothyroidism, unspecified: Secondary | ICD-10-CM | POA: Diagnosis present

## 2018-08-31 DIAGNOSIS — K219 Gastro-esophageal reflux disease without esophagitis: Secondary | ICD-10-CM | POA: Diagnosis present

## 2018-08-31 DIAGNOSIS — G35 Multiple sclerosis: Secondary | ICD-10-CM | POA: Diagnosis present

## 2018-08-31 DIAGNOSIS — Z79890 Hormone replacement therapy (postmenopausal): Secondary | ICD-10-CM

## 2018-08-31 DIAGNOSIS — Z803 Family history of malignant neoplasm of breast: Secondary | ICD-10-CM | POA: Diagnosis not present

## 2018-08-31 DIAGNOSIS — Z882 Allergy status to sulfonamides status: Secondary | ICD-10-CM

## 2018-08-31 DIAGNOSIS — R29810 Facial weakness: Secondary | ICD-10-CM | POA: Diagnosis not present

## 2018-08-31 DIAGNOSIS — E7800 Pure hypercholesterolemia, unspecified: Secondary | ICD-10-CM | POA: Diagnosis present

## 2018-08-31 DIAGNOSIS — I10 Essential (primary) hypertension: Secondary | ICD-10-CM | POA: Diagnosis present

## 2018-08-31 DIAGNOSIS — E785 Hyperlipidemia, unspecified: Secondary | ICD-10-CM | POA: Diagnosis present

## 2018-08-31 DIAGNOSIS — R609 Edema, unspecified: Secondary | ICD-10-CM | POA: Diagnosis present

## 2018-08-31 DIAGNOSIS — S0502XA Injury of conjunctiva and corneal abrasion without foreign body, left eye, initial encounter: Secondary | ICD-10-CM | POA: Diagnosis present

## 2018-08-31 DIAGNOSIS — T7840XA Allergy, unspecified, initial encounter: Secondary | ICD-10-CM | POA: Diagnosis present

## 2018-08-31 LAB — COMPREHENSIVE METABOLIC PANEL
ALT: 22 U/L (ref 0–44)
AST: 18 U/L (ref 15–41)
Albumin: 4.4 g/dL (ref 3.5–5.0)
Alkaline Phosphatase: 41 U/L (ref 38–126)
Anion gap: 9 (ref 5–15)
BUN: 14 mg/dL (ref 6–20)
CO2: 20 mmol/L — ABNORMAL LOW (ref 22–32)
Calcium: 9.2 mg/dL (ref 8.9–10.3)
Chloride: 109 mmol/L (ref 98–111)
Creatinine, Ser: 1 mg/dL (ref 0.44–1.00)
GFR calc Af Amer: >60 mL/min (ref >60)
GFR calc non Af Amer: >60 mL/min (ref >60)
Glucose, Bld: 126 mg/dL — ABNORMAL HIGH (ref 70–99)
Potassium: 4 mmol/L (ref 3.5–5.1)
Sodium: 138 mmol/L (ref 135–145)
Total Bilirubin: 0.4 mg/dL (ref 0.3–1.2)
Total Protein: 7.3 g/dL (ref 6.5–8.1)

## 2018-08-31 LAB — CBC WITH DIFFERENTIAL/PLATELET
Abs Immature Granulocytes: 0.04 10*3/uL (ref 0.00–0.07)
Basophils Absolute: 0 10*3/uL (ref 0.0–0.1)
Basophils Relative: 0 %
Eosinophils Absolute: 0 10*3/uL (ref 0.0–0.5)
Eosinophils Relative: 0 %
HCT: 47 % — ABNORMAL HIGH (ref 36.0–46.0)
Hemoglobin: 15.5 g/dL — ABNORMAL HIGH (ref 12.0–15.0)
Immature Granulocytes: 0 %
Lymphocytes Relative: 6 %
Lymphs Abs: 0.7 10*3/uL (ref 0.7–4.0)
MCH: 27.8 pg (ref 26.0–34.0)
MCHC: 33 g/dL (ref 30.0–36.0)
MCV: 84.2 fL (ref 80.0–100.0)
Monocytes Absolute: 0.2 10*3/uL (ref 0.1–1.0)
Monocytes Relative: 2 %
Neutro Abs: 10.8 10*3/uL — ABNORMAL HIGH (ref 1.7–7.7)
Neutrophils Relative %: 92 %
Platelets: 286 10*3/uL (ref 150–400)
RBC: 5.58 MIL/uL — ABNORMAL HIGH (ref 3.87–5.11)
RDW: 13.2 % (ref 11.5–15.5)
WBC: 11.7 10*3/uL — ABNORMAL HIGH (ref 4.0–10.5)
nRBC: 0 % (ref 0.0–0.2)

## 2018-08-31 LAB — SARS CORONAVIRUS 2 BY RT PCR (HOSPITAL ORDER, PERFORMED IN ~~LOC~~ HOSPITAL LAB): SARS Coronavirus 2: NEGATIVE

## 2018-08-31 MED ORDER — DIMETHYL FUMARATE 240 MG PO CPDR
240.0000 mg | DELAYED_RELEASE_CAPSULE | Freq: Two times a day (BID) | ORAL | Status: DC
  Administered 2018-08-31: 240 mg via ORAL
  Filled 2018-08-31: qty 1

## 2018-08-31 MED ORDER — TOPIRAMATE 100 MG PO TABS
100.0000 mg | ORAL_TABLET | Freq: Every day | ORAL | Status: DC
  Administered 2018-08-31 – 2018-09-02 (×3): 100 mg via ORAL
  Filled 2018-08-31: qty 4
  Filled 2018-08-31 (×3): qty 1
  Filled 2018-08-31: qty 4

## 2018-08-31 MED ORDER — METHYLPREDNISOLONE SODIUM SUCC 125 MG IJ SOLR
125.0000 mg | Freq: Once | INTRAMUSCULAR | Status: AC
  Administered 2018-08-31: 125 mg via INTRAVENOUS
  Filled 2018-08-31: qty 2

## 2018-08-31 MED ORDER — VANCOMYCIN HCL 10 G IV SOLR
2000.0000 mg | Freq: Once | INTRAVENOUS | Status: DC
  Filled 2018-08-31: qty 2000

## 2018-08-31 MED ORDER — LEVOTHYROXINE SODIUM 25 MCG PO TABS
25.0000 ug | ORAL_TABLET | Freq: Every day | ORAL | Status: DC
  Administered 2018-09-01 – 2018-09-03 (×3): 25 ug via ORAL
  Filled 2018-08-31 (×3): qty 1

## 2018-08-31 MED ORDER — ONDANSETRON HCL 4 MG PO TABS: 4.0000 mg | ORAL_TABLET | Freq: Four times a day (QID) | ORAL | Status: DC | PRN

## 2018-08-31 MED ORDER — DIMETHYL FUMARATE 240 MG PO CPDR
240.0000 mg | DELAYED_RELEASE_CAPSULE | Freq: Two times a day (BID) | ORAL | Status: DC
  Administered 2018-09-01 – 2018-09-03 (×5): 240 mg via ORAL
  Filled 2018-08-31 (×5): qty 1

## 2018-08-31 MED ORDER — ACETAMINOPHEN 650 MG RE SUPP: 650.0000 mg | Freq: Four times a day (QID) | RECTAL | Status: DC | PRN

## 2018-08-31 MED ORDER — DIPHENHYDRAMINE HCL 50 MG/ML IJ SOLN
25.0000 mg | Freq: Once | INTRAMUSCULAR | Status: AC
  Administered 2018-08-31: 13:00:00 25 mg via INTRAVENOUS
  Filled 2018-08-31: qty 1

## 2018-08-31 MED ORDER — ACETAMINOPHEN 325 MG PO TABS
650.0000 mg | ORAL_TABLET | Freq: Four times a day (QID) | ORAL | Status: DC | PRN
  Administered 2018-09-01: 05:00:00 650 mg via ORAL
  Filled 2018-08-31: qty 2

## 2018-08-31 MED ORDER — FAMOTIDINE IN NACL 20-0.9 MG/50ML-% IV SOLN
20.0000 mg | Freq: Once | INTRAVENOUS | Status: AC
  Administered 2018-08-31: 20 mg via INTRAVENOUS
  Filled 2018-08-31: qty 50

## 2018-08-31 MED ORDER — ENOXAPARIN SODIUM 40 MG/0.4ML ~~LOC~~ SOLN
40.0000 mg | SUBCUTANEOUS | Status: DC
  Administered 2018-08-31 – 2018-09-02 (×3): 40 mg via SUBCUTANEOUS
  Filled 2018-08-31 (×3): qty 0.4

## 2018-08-31 MED ORDER — ONDANSETRON HCL 4 MG/2ML IJ SOLN: 4.0000 mg | Freq: Four times a day (QID) | INTRAMUSCULAR | Status: DC | PRN

## 2018-08-31 MED ORDER — PANTOPRAZOLE SODIUM 40 MG PO TBEC
40.0000 mg | DELAYED_RELEASE_TABLET | Freq: Every evening | ORAL | Status: DC
  Administered 2018-08-31 – 2018-09-02 (×3): 40 mg via ORAL
  Filled 2018-08-31 (×3): qty 1

## 2018-08-31 MED ORDER — VANCOMYCIN HCL 10 G IV SOLR
2500.0000 mg | Freq: Once | INTRAVENOUS | Status: AC
  Administered 2018-08-31: 16:00:00 2500 mg via INTRAVENOUS
  Filled 2018-08-31: qty 2500

## 2018-08-31 MED ORDER — SIMVASTATIN 40 MG PO TABS
40.0000 mg | ORAL_TABLET | Freq: Every evening | ORAL | Status: DC
  Administered 2018-08-31 – 2018-09-02 (×3): 40 mg via ORAL
  Filled 2018-08-31: qty 1
  Filled 2018-08-31: qty 4
  Filled 2018-08-31: qty 1
  Filled 2018-08-31: qty 2
  Filled 2018-08-31: qty 1
  Filled 2018-08-31: qty 4
  Filled 2018-08-31: qty 2

## 2018-08-31 MED ORDER — MIRABEGRON ER 50 MG PO TB24
50.0000 mg | ORAL_TABLET | Freq: Every day | ORAL | Status: DC
  Administered 2018-08-31 – 2018-09-03 (×4): 50 mg via ORAL
  Filled 2018-08-31 (×4): qty 1

## 2018-08-31 MED ORDER — METHYLPREDNISOLONE SODIUM SUCC 40 MG IJ SOLR
40.0000 mg | Freq: Two times a day (BID) | INTRAMUSCULAR | Status: DC
  Administered 2018-08-31 – 2018-09-02 (×5): 40 mg via INTRAVENOUS
  Filled 2018-08-31 (×5): qty 1

## 2018-08-31 MED ORDER — FAMOTIDINE IN NACL 20-0.9 MG/50ML-% IV SOLN
20.0000 mg | Freq: Two times a day (BID) | INTRAVENOUS | Status: DC
  Administered 2018-09-01 – 2018-09-03 (×5): 20 mg via INTRAVENOUS
  Filled 2018-08-31 (×5): qty 50

## 2018-08-31 MED ORDER — LISINOPRIL 10 MG PO TABS
40.0000 mg | ORAL_TABLET | Freq: Every evening | ORAL | Status: DC
  Administered 2018-08-31 – 2018-09-02 (×3): 40 mg via ORAL
  Filled 2018-08-31 (×3): qty 4

## 2018-08-31 MED ORDER — VANCOMYCIN HCL IN DEXTROSE 1-5 GM/200ML-% IV SOLN
1000.0000 mg | Freq: Two times a day (BID) | INTRAVENOUS | Status: DC
  Administered 2018-09-01 – 2018-09-02 (×4): 1000 mg via INTRAVENOUS
  Filled 2018-08-31 (×5): qty 200

## 2018-08-31 MED ORDER — DIPHENHYDRAMINE HCL 50 MG/ML IJ SOLN: 12.5000 mg | Freq: Four times a day (QID) | INTRAMUSCULAR | Status: DC | PRN

## 2018-08-31 NOTE — ED Notes (Signed)
Pt reports no change in her condition No change visible to this nurse NAD noted at this time

## 2018-08-31 NOTE — ED Triage Notes (Addendum)
Pt arrived via EMS for report of allergic reaction - she was seen here yesterday and given atb for preseptal cellulitis - today she has extensive facial swelling to the point that her eyes are swollen shut - edema also noted to face- denies shortness of breath and does not appear in respiratory distress at this time - is able to maintain airway and speak in clear sentences

## 2018-08-31 NOTE — ED Notes (Signed)
Pt resting quietly - arouses easily - denies any SHOB, NAD noted at this time, able to control airway - eyes continue to be swollen shut

## 2018-08-31 NOTE — ED Notes (Signed)
ED TO INPATIENT HANDOFF REPORT  ED Nurse Name and Phone #:  Mykaila Blunck/Sonjia [phone]  S Name/Age/Gender Katrina Murray 48 y.o. female Room/Bed: ED10A/ED10A  Code Status   Code Status: Full Code  Home/SNF/Other Home Patient oriented to: self, place, time and situation Is this baseline? Yes   Triage Complete: Triage complete  Chief Complaint allergic reaction  Triage Note Pt arrived via EMS for report of allergic reaction - she was seen here yesterday and given atb for preseptal cellulitis - today she has extensive facial swelling to the point that her eyes are swollen shut - edema also noted to face- denies shortness of breath and does not appear in respiratory distress at this time - is able to maintain airway and speak in clear sentences   Allergies Allergies  Allergen Reactions  . Sulfamethoxazole-Trimethoprim Swelling    Level of Care/Admitting Diagnosis ED Disposition    ED Disposition Condition Comment   Admit  [facility] [[id number]]  Level of Care: Med-Surg [16]  Covid Evaluation: Asymptomatic Screening Protocol (No Symptoms)  Diagnosis: Allergic reaction caused by a drug [[id number]][[id number]]  Admitting Physician: Alford HighlandWIETING, RICHARD [[id number]]  Attending Physician: Alford HighlandWIETING, RICHARD [phone][[id number]]  Estimated length of stay: past midnight tomorrow  Certification:: I certify this patient will need inpatient services for at least 2 midnights  PT Class (Do Not Modify): Inpatient [101]  PT Acc Code (Do Not Modify): Private [1]       B Medical/Surgery History Past Medical History:  Diagnosis Date  . Gastritis, chronic   . GERD (gastroesophageal reflux disease)   . Hypercholesteremia   . Hyperlipidemia   . Hypertension   . Hypothyroidism   . Multiple sclerosis (HCC)    Past Surgical History:  Procedure Laterality Date  . ABDOMINAL HYSTERECTOMY    . COLONOSCOPY WITH PROPOFOL N/A 01/29/2017   Procedure: COLONOSCOPY WITH PROPOFOL;  Surgeon: Toledo,  Boykin Nearingeodoro K, MD;  Location: [facility] ENDOSCOPY;  Service: Gastroenterology;  Laterality: N/A;  . ESOPHAGOGASTRODUODENOSCOPY (EGD) WITH PROPOFOL N/A 07/12/2016   Procedure: ESOPHAGOGASTRODUODENOSCOPY (EGD) WITH PROPOFOL;  Surgeon: Christena DeemSkulskie, Martin U, MD;  [facility];  Service: Endoscopy;  Laterality: N/A;  . PARTIAL HYSTERECTOMY    . TUBAL LIGATION       A IV Location/Drains/Wounds Patient Lines/Drains/Airways Status   Active Line/Drains/Airways    Name:   Placement date:   Placement time:   Site:   Days:   Peripheral IV 08/31/18 Left Hand   08/31/18    0928    Hand   less than 1          Intake/Output Last 24 hours  Intake/Output Summary (Last 24 hours) at 08/31/2018 1438 Last data filed at 08/31/2018 1034 Gross per 24 hour  Intake 100 ml  Output -  Net 100 ml    Labs/Imaging Results for orders placed or performed during the hospital encounter of 08/31/18 (from the past 48 hour(s))  SARS Coronavirus 2 (CEPHEID - Performed in [facility] lab), Hosp Order     Status: None   Collection Time: 08/31/18 12:11 PM   Specimen: Nasopharyngeal Swab  Result Value Ref Range   SARS Coronavirus 2 NEGATIVE NEGATIVE    Comment: (NOTE) If result is NEGATIVE SARS-CoV-2 target nucleic acids are NOT DETECTED. The SARS-CoV-2 RNA is generally detectable in upper and lower  respiratory specimens during the acute phase of infection. The lowest  concentration of SARS-CoV-2 viral copies this assay can detect is 250  copies / mL. A negative  result does not preclude SARS-CoV-2 infection  and should not be used as the sole basis for treatment or other  patient management decisions.  A negative result may occur with  improper specimen collection / handling, submission of specimen other  than nasopharyngeal swab, presence of viral mutation(s) within the  areas targeted by this assay, and inadequate number of viral copies  (<250 copies / mL). A negative result must be combined with  clinical  observations, patient history, and epidemiological information. If result is POSITIVE SARS-CoV-2 target nucleic acids are DETECTED. The SARS-CoV-2 RNA is generally detectable in upper and lower  respiratory specimens dur ing the acute phase of infection.  Positive  results are indicative of active infection with SARS-CoV-2.  Clinical  correlation with patient history and other diagnostic information is  necessary to determine patient infection status.  Positive results do  not rule out bacterial infection or co-infection with other viruses. If result is PRESUMPTIVE POSTIVE SARS-CoV-2 nucleic acids MAY BE PRESENT.   A presumptive positive result was obtained on the submitted specimen  and confirmed on repeat testing.  While 2019 novel coronavirus  (SARS-CoV-2) nucleic acids may be present in the submitted sample  additional confirmatory testing may be necessary for epidemiological  and / or clinical management purposes  to differentiate between  SARS-CoV-2 and other Sarbecovirus currently known to infect humans.  If clinically indicated additional testing with an alternate test  methodology [phone]) is advised. The SARS-CoV-2 RNA is generally  detectable in upper and lower respiratory sp ecimens during the acute  phase of infection. The expected result is Negative. Fact Sheet for Patients:  BoilerBrush.com.cy Fact Sheet for Healthcare Providers: https://pope.com/ This test is not yet approved or cleared by the [place] FDA and has been authorized for detection and/or diagnosis of SARS-CoV-2 by FDA under an Emergency Use Authorization (EUA).  This EUA will remain in effect (meaning this test can be used) for the duration of the COVID-19 declaration under Section 564(b)(1) of the Act, 21 U.S.C. section 360bbb-3(b)(1), unless the authorization is terminated or revoked sooner. Performed at [facility], [address] [place], [place] [address]   CBC with Differential     Status: Abnormal   Collection Time: 08/31/18 12:28 PM  Result Value Ref Range   WBC 11.7 (H) 4.0 - 10.5 K/uL   RBC 5.58 (H) 3.87 - 5.11 MIL/uL   Hemoglobin 15.5 (H) 12.0 - 15.0 g/dL   HCT 70.7 (H) 86.7 - 54.4 %   MCV 84.2 80.0 - 100.0 fL   MCH 27.8 26.0 - 34.0 pg   MCHC 33.0 30.0 - 36.0 g/dL   RDW 92.0 10.0 - 71.2 %   Platelets 286 150 - 400 K/uL   nRBC 0.0 0.0 - 0.2 %   Neutrophils Relative % 92 %   Neutro Abs 10.8 (H) 1.7 - 7.7 K/uL   Lymphocytes Relative 6 %   Lymphs Abs 0.7 0.7 - 4.0 K/uL   Monocytes Relative 2 %   Monocytes Absolute 0.2 0.1 - 1.0 K/uL   Eosinophils Relative 0 %   Eosinophils Absolute 0.0 0.0 - 0.5 K/uL   Basophils Relative 0 %   Basophils Absolute 0.0 0.0 - 0.1 K/uL   Immature Granulocytes 0 %   Abs Immature Granulocytes 0.04 0.00 - 0.07 K/uL    Comment: Performed at [facility], [address] [place], [place] [address]  Comprehensive metabolic panel     Status: Abnormal   Collection Time: 08/31/18  12:28 PM  Result Value Ref Range   Sodium 138 135 - 145 mmol/L   Potassium 4.0 3.5 - 5.1 mmol/L   Chloride 109 98 - 111 mmol/L   CO2 20 (L) 22 - 32 mmol/L   Glucose, Bld 126 (H) 70 - 99 mg/dL   BUN 14 6 - 20 mg/dL   Creatinine, Ser 1.611.00 0.44 - 1.00 mg/dL   Calcium 9.2 8.9 - 09.610.3 mg/dL   Total Protein 7.3 6.5 - 8.1 g/dL   Albumin 4.4 3.5 - 5.0 g/dL   AST 18 15 - 41 U/L   ALT 22 0 - 44 U/L   Alkaline Phosphatase 41 38 - 126 U/L   Total Bilirubin 0.4 0.3 - 1.2 mg/dL   GFR calc non Af Amer >60 >60 mL/min   GFR calc Af Amer >60 >60 mL/min   Anion gap 9 5 - 15    Comment: Performed at [facility], [address] [place], [place] [address]   Ct Orbits W Contrast  Result Date: 08/30/2018 CLINICAL DATA:  Left periorbital cellulitis. Left eye swelling and pain with photophobia. EXAM: CT ORBITS WITH CONTRAST TECHNIQUE: Multidetector CT images was performed according to the  standard protocol following intravenous contrast administration. CONTRAST:  75mL OMNIPAQUE IOHEXOL 300 MG/ML  SOLN COMPARISON:  Brain MRI 04/05/2010 FINDINGS: Orbits: Mild left periorbital soft tissue swelling without abscess. No evidence of postseptal inflammation. Intact globes. No mass or fracture. Visualized sinuses: Paranasal sinuses and mastoid air cells are clear. Soft tissues: Left periorbital swelling mildly extends into the pre maxillary soft tissues. Limited intracranial: Cerebral white matter hypodensities most notable in the left centrum semiovale corresponding to T2/FLAIR hyperintense lesions on the prior MRI in this patient with a history of multiple sclerosis. IMPRESSION: Left periorbital soft tissue swelling compatible with cellulitis. No evidence of postseptal cellulitis or abscess. Electronically Signed   By: Sebastian AcheAllen  Grady M.D.   On: 08/30/2018 15:25    Pending Labs Unresulted Labs (From admission, onward)    Start     Ordered   09/07/18 0500  Creatinine, serum  (enoxaparin (LOVENOX)    CrCl >/= 30 ml/min)  Weekly,   STAT    Comments: while on enoxaparin therapy    08/31/18 1404   09/01/18 0500  Basic metabolic panel  Tomorrow morning,   STAT     08/31/18 1404   09/01/18 0500  CBC  Tomorrow morning,   STAT     08/31/18 1404          Vitals/Pain Today's Vitals   08/31/18 1200 08/31/18 1230 08/31/18 1400 08/31/18 1406  BP: (!) 132/93   (!) 148/104  Pulse: 78 83 76 72  Resp: 16 19 18 17   Temp:      TempSrc:      SpO2: 96% 95% 94% 96%  Weight:      Height:      PainSc:        Isolation Precautions No active isolations  Medications Medications  methylPREDNISolone sodium succinate (SOLU-MEDROL) 40 mg/mL injection 40 mg (has no administration in time range)  famotidine (PEPCID) IVPB 20 mg premix (has no administration in time range)  diphenhydrAMINE (BENADRYL) injection 12.5 mg (has no administration in time range)  enoxaparin (LOVENOX) injection 40 mg (has no  administration in time range)  acetaminophen (TYLENOL) tablet 650 mg (has no administration in time range)    Or  acetaminophen (TYLENOL) suppository 650 mg (has no administration in time range)  ondansetron (ZOFRAN) tablet 4  mg (has no administration in time range)    Or  ondansetron (ZOFRAN) injection 4 mg (has no administration in time range)  lisinopril (ZESTRIL) tablet 40 mg (has no administration in time range)  simvastatin (ZOCOR) tablet 40 mg (has no administration in time range)  Dimethyl Fumarate CPDR 240 mg (has no administration in time range)  levothyroxine (SYNTHROID) tablet 25 mcg (has no administration in time range)  pantoprazole (PROTONIX) EC tablet 40 mg (has no administration in time range)  mirabegron ER (MYRBETRIQ) tablet 50 mg (has no administration in time range)  topiramate (TOPAMAX) tablet 100 mg (has no administration in time range)  vancomycin (VANCOCIN) 2,500 mg in sodium chloride 0.9 % 500 mL IVPB (has no administration in time range)  methylPREDNISolone sodium succinate (SOLU-MEDROL) 125 mg/2 mL injection 125 mg (125 mg Intravenous Given 08/31/18 0928)  famotidine (PEPCID) IVPB 20 mg premix (0 mg Intravenous Stopped 08/31/18 1034)  diphenhydrAMINE (BENADRYL) injection 25 mg (25 mg Intravenous Given 08/31/18 1243)    Mobility walks Low fall risk   Focused Assessments Pt is A&Ox4. Ambulatory with steady gait.   Pt seen in fast track in ED yesterday, given x1 IV abt dose and sent home with 2 oral abt and 1 abt eyedrops. This RN Elmo Putt) saw pt then too. Yesterday she clearly had orbital cellulitis but was able to see, today face is so swollen pt is unable to see from either eye even after epi-pen, solu-medrol, IV pepcid, and PO and IV benadryl. No airway compromise. Sat 96% RA.    R Recommendations: See Admitting Provider Note  Report given to:   Additional Notes:

## 2018-08-31 NOTE — ED Provider Notes (Signed)
[facility] Emergency Department Provider Note   ____________________________________________    I have reviewed the triage vital signs and the nursing notes.   HISTORY  Chief Complaint Allergic Reaction     HPI Katrina Murray is a 48 y.o. female seen yesterday for pain and swelling in her left face.  There was concern for preseptal cellulitis patient was put on cefdinir and Bactrim and received Rocephin in the emergency department.  She reports she was doing fine when she left the emergency department took her p.o. medications around 8 PM.  In the middle of the night she woke up with her face was burning and developed swelling throughout.  Denies any difficulty breathing.  Does report itching.  Past Medical History:  Diagnosis Date  . Gastritis, chronic   . GERD (gastroesophageal reflux disease)   . Hypercholesteremia   . Hyperlipidemia   . Hypertension   . Hypothyroidism   . Multiple sclerosis [facility])     Patient Active Problem List   Diagnosis Date Noted  . Chest pain 02/18/2018    Past Surgical History:  Procedure Laterality Date  . ABDOMINAL HYSTERECTOMY    . COLONOSCOPY WITH PROPOFOL N/A 01/29/2017   Procedure: COLONOSCOPY WITH PROPOFOL;  Surgeon: Toledo, Boykin Nearing, MD;  Location: [facility] ENDOSCOPY;  Service: Gastroenterology;  Laterality: N/A;  . ESOPHAGOGASTRODUODENOSCOPY (EGD) WITH PROPOFOL N/A 07/12/2016   Procedure: ESOPHAGOGASTRODUODENOSCOPY (EGD) WITH PROPOFOL;  Surgeon: Christena Deem, MD;  Location: [facility] ENDOSCOPY;  Service: Endoscopy;  Laterality: N/A;  . PARTIAL HYSTERECTOMY    . TUBAL LIGATION      Prior to Admission medications   Medication Sig Start Date End Date Taking? Authorizing Provider  cefdinir (OMNICEF) 300 MG capsule Take 1 capsule (300 mg total) by mouth 2 (two) times daily for 10 days. 08/30/18 09/09/18 Yes Darci Current, MD  Dimethyl Fumarate (TECFIDERA) 240 MG CPDR Take 240 mg by mouth 2 (two) times  daily.   Yes [provider]  erythromycin ophthalmic ointment Place 1 application into the left eye at bedtime for 7 days. 08/30/18 09/06/18 Yes Darci Current, MD  levothyroxine (SYNTHROID, LEVOTHROID) 25 MCG tablet Take 25 mcg by mouth daily.    Yes [provider]  lisinopril (PRINIVIL,ZESTRIL) 40 MG tablet Take 40 mg by mouth every evening.    Yes [provider]  mirabegron ER (MYRBETRIQ) 50 MG TB24 tablet Take 1 tablet (50 mg total) by mouth daily. 03/09/18  Yes MacDiarmid, Lorin Picket, MD  pantoprazole (PROTONIX) 40 MG tablet Take 40 mg by mouth every evening.    Yes [provider]  simvastatin (ZOCOR) 40 MG tablet Take 40 mg by mouth every evening.    Yes [provider]  topiramate (TOPAMAX) 100 MG tablet Take 100 mg by mouth at bedtime.    Yes [provider]  traMADol (ULTRAM) 50 MG tablet Take 1 tablet (50 mg total) by mouth every 6 (six) hours as needed. 08/30/18 08/30/19 Yes Darci Current, MD     Allergies Ceftriaxone  Family History  Problem Relation Age of Onset  . Breast cancer Maternal Aunt   . Bladder Cancer Neg Hx   . Kidney cancer Neg Hx     Social History Social History   Tobacco Use  . Smoking status: Former Smoker    Quit date: 2008    Years since quitting: 12.5  . Smokeless tobacco: Never Used  Substance Use Topics  . Alcohol use: No    Comment:  occasional  . Drug use: No    Review of Systems  Constitutional: No fever/chills Eyes: Swelling of the orbits bilaterally ENT: No sore throat. Cardiovascular: Denies chest pain. Respiratory: No difficulty breathing Gastrointestinal: No abdominal pain.    Genitourinary: Negative for dysuria. Musculoskeletal: Negative for back pain. Skin: Negative for rash. Neurological: Negative for headaches    ____________________________________________   PHYSICAL EXAM:  VITAL SIGNS: ED Triage Vitals  Enc Vitals Group     BP 08/31/18 0925 (!) 162/113      Pulse Rate 08/31/18 0925 75     Resp 08/31/18 0925 15     Temp 08/31/18 0925 98.4 F (36.9 C)     Temp Source 08/31/18 0925 Oral     SpO2 08/31/18 0925 98 %     Weight 08/31/18 0923 117.9 kg (260 lb)     Height 08/31/18 0923 1.753 m (5\' 9" )     Head Circumference --      Peak Flow --      Pain Score 08/31/18 0923 8     Pain Loc --      Pain Edu? --      Excl. in [place]? --     Constitutional: Alert and oriented.  Eyes: Significant swelling of the face certainly around the orbits, patient is unable to open her eyes because of swelling  Nose: No congestion/rhinnorhea. Mouth/Throat: Mucous membranes are moist.  Pharynx is normal, no swelling Neck:  Painless ROM Cardiovascular: Normal rate, regular rhythm. Grossly normal heart sounds.  Good peripheral circulation. Respiratory: Normal respiratory effort.  No retractions. Lungs CTAB. Gastrointestinal: Soft and nontender. No distention.  No CVA tenderness.  Musculoskeletal:  Warm and well perfused Neurologic:  Normal speech and language. No gross focal neurologic deficits are appreciated.  Skin:  Skin is warm, dry and intact. No rash noted. Psychiatric: Mood and affect are normal. Speech and behavior are normal.  ____________________________________________   LABS (all labs ordered are listed, but only abnormal results are displayed)  Labs Reviewed  SARS CORONAVIRUS 2 (HOSPITAL ORDER, [place] [facility])   ____________________________________________  EKG  None ____________________________________________  RADIOLOGY  None ____________________________________________   PROCEDURES  Procedure(s) performed: No  Procedures   Critical Care performed: no ____________________________________________   INITIAL IMPRESSION / ASSESSMENT AND PLAN / ED COURSE  Pertinent labs & imaging results that were available during my care of the patient were reviewed by me and considered in my medical decision making  (see chart for details).  Patient presents with diffuse swelling of the face concerning for angioedema/allergic reaction.  Received epi via EMS, took Benadryl 50 mg at home.  Will give IV Solu-Medrol IV Pepcid here and monitor carefully.  Unclear whether the patient had allergic reaction to p.o. antibiotics or perhaps initial presentation yesterday was related to early allergic reaction and not an infection.  Afebrile.  Clinical Course as of Aug 31 1211  Mon Aug 31, 2018  1156 Patient with mild improvement, still cannot open eyes fully due to swelling    [RK]    Clinical Course User Index [RK] Lavonia Drafts, MD     ----------------------------------------- 12:12 PM on 08/31/2018 -----------------------------------------  Patient with minimal improvement, will add additional Benadryl.  Will discuss with hospitalist for admission ____________________________________________   FINAL CLINICAL IMPRESSION(S) / ED DIAGNOSES  Final diagnoses:  Angioedema, initial encounter  Allergic reaction, initial encounter        Note:  This document was prepared using Dragon voice recognition software and may  include unintentional dictation errors.   Jene EveryKinner, Veronnica Hennings, MD 08/31/18 1213

## 2018-08-31 NOTE — H&P (Signed)
[facility] -  at [facility]   PATIENT NAME: Katrina CitrinMelanie Murray    MR#:  [id number]  DATE OF BIRTH:  November 18, 1970  DATE OF ADMISSION:  08/31/2018  PRIMARY CARE PHYSICIAN: Alvina FilbertHunter, Denise, MD   REQUESTING/REFERRING PHYSICIAN: Dr Jene Everyobert Kinner  CHIEF COMPLAINT:   Chief Complaint  Patient presents with  . Allergic Reaction    HISTORY OF PRESENT ILLNESS:  Katrina CitrinMelanie Murray  is a 48 y.o. female coming in after an eyelash got caught in her eye.  She started using some eyedrops she woke up and her face was swollen on the left side and red.  She went to the urgent care and was referred into the ER.  She was given a dose of Rocephin and discharged home on Bactrim and Omnicef.  She took those medications and then her face started swelling and her eyes were swollen shut bilaterally.  Hospitalist services were contacted for further evaluation.  Patient states that she has had penicillins in the past without a problem.  Does not recall all the antibiotics that she is taken in the past.  PAST MEDICAL HISTORY:   Past Medical History:  Diagnosis Date  . Gastritis, chronic   . GERD (gastroesophageal reflux disease)   . Hypercholesteremia   . Hyperlipidemia   . Hypertension   . Hypothyroidism   . Multiple sclerosis (HCC)     PAST SURGICAL HISTORY:   Past Surgical History:  Procedure Laterality Date  . ABDOMINAL HYSTERECTOMY    . COLONOSCOPY WITH PROPOFOL N/A 01/29/2017   Procedure: COLONOSCOPY WITH PROPOFOL;  Surgeon: Toledo, Boykin Nearingeodoro K, MD;  Location: [facility] ENDOSCOPY;  Service: Gastroenterology;  Laterality: N/A;  . ESOPHAGOGASTRODUODENOSCOPY (EGD) WITH PROPOFOL N/A 07/12/2016   Procedure: ESOPHAGOGASTRODUODENOSCOPY (EGD) WITH PROPOFOL;  Surgeon: Christena DeemSkulskie, Martin U, MD;  [facility];  Service: Endoscopy;  Laterality: N/A;  . PARTIAL HYSTERECTOMY    . TUBAL LIGATION      SOCIAL HISTORY:   Social History   Tobacco Use  . Smoking status: Former Smoker     Quit date: 2008    Years since quitting: 12.5  . Smokeless tobacco: Never Used  Substance Use Topics  . Alcohol use: No    Comment: occasional    FAMILY HISTORY:   Family History  Problem Relation Age of Onset  . Breast cancer Maternal Aunt   . Heart failure Mother   . CAD Father   . CAD Brother   . Heart failure Brother   . Bladder Cancer Neg Hx   . Kidney cancer Neg Hx     DRUG ALLERGIES:  No Active Allergies  REVIEW OF SYSTEMS:  CONSTITUTIONAL: No fever, chills or sweats.  No fatigue or weakness.  EYES: With eyelid swollen shut she has decreased vision EARS, NOSE, AND THROAT: No tinnitus or ear pain. No sore throat RESPIRATORY: No cough, shortness of breath, wheezing or hemoptysis.  CARDIOVASCULAR: No chest pain, orthopnea, edema.  GASTROINTESTINAL: No nausea, vomiting, diarrhea or abdominal pain. No blood in bowel movements GENITOURINARY: No dysuria, hematuria.  ENDOCRINE: No polyuria, nocturia,  HEMATOLOGY: No anemia, easy bruising or bleeding SKIN: No rash or lesion. MUSCULOSKELETAL: Does have joint pain and muscle weakness with her MS. NEUROLOGIC: History of muscle weakness with numbness.  PSYCHIATRY: No anxiety or depression.   MEDICATIONS AT HOME:   Prior to Admission medications   Medication Sig Start Date End Date Taking? Authorizing Provider  cefdinir (OMNICEF) 300 MG capsule Take 1 capsule (300 mg total) by mouth 2 (two)  times daily for 10 days. 08/30/18 09/09/18 Yes Gregor Hams, MD  Dimethyl Fumarate (TECFIDERA) 240 MG CPDR Take 240 mg by mouth 2 (two) times daily.   Yes [provider]  erythromycin ophthalmic ointment Place 1 application into the left eye at bedtime for 7 days. 08/30/18 09/06/18 Yes Gregor Hams, MD  levothyroxine (SYNTHROID, LEVOTHROID) 25 MCG tablet Take 25 mcg by mouth daily.    Yes [provider]  lisinopril (PRINIVIL,ZESTRIL) 40 MG tablet Take 40 mg by mouth every evening.    Yes [provider]  mirabegron ER (MYRBETRIQ) 50 MG TB24 tablet Take 1 tablet (50 mg total) by mouth daily. 03/09/18  Yes MacDiarmid, Nicki Reaper, MD  pantoprazole (PROTONIX) 40 MG tablet Take 40 mg by mouth every evening.    Yes [provider]  simvastatin (ZOCOR) 40 MG tablet Take 40 mg by mouth every evening.    Yes [provider]  topiramate (TOPAMAX) 100 MG tablet Take 100 mg by mouth at bedtime.    Yes [provider]  traMADol (ULTRAM) 50 MG tablet Take 1 tablet (50 mg total) by mouth every 6 (six) hours as needed. 08/30/18 08/30/19 Yes Gregor Hams, MD      VITAL SIGNS:  Blood pressure (!) 148/104, pulse 72, temperature 98.4 F (36.9 C), temperature source Oral, resp. rate 17, height 5\' 9"  (1.753 m), weight 117.9 kg, SpO2 96 %.  PHYSICAL EXAMINATION:  GENERAL:  48 y.o.-year-old patient lying in the bed with no acute distress.  EYES: Barely able to open up the eyes because the eyelids are swollen so much. HEENT: Head atraumatic, normocephalic. Oropharynx and nasopharynx clear.  NECK:  Supple, no jugular venous distention. No thyroid enlargement, no tenderness.  LUNGS: Normal breath sounds bilaterally, no wheezing, rales,rhonchi or crepitation. No use of accessory muscles of respiration.  CARDIOVASCULAR: S1, S2 normal. No murmurs, rubs, or gallops.  ABDOMEN: Soft, nontender, nondistended. Bowel sounds present. No organomegaly or mass.  EXTREMITIES: No pedal edema, cyanosis, or clubbing.  NEUROLOGIC: Cranial nerves II through XII are intact. Muscle strength 5/5 in all extremities. Sensation intact. Gait not checked.  PSYCHIATRIC: The patient is alert and oriented x 3.  SKIN: Erythema the entire face with swollen eyelids.  LABORATORY PANEL:   CBC Recent Labs  Lab 08/31/18 1228  WBC 11.7*  HGB 15.5*  HCT 47.0*  PLT 286   ------------------------------------------------------------------------------------------------------------------  Chemistries  Recent Labs  Lab  08/31/18 1228  NA 138  K 4.0  CL 109  CO2 20*  GLUCOSE 126*  BUN 14  CREATININE 1.00  CALCIUM 9.2  AST 18  ALT 22  ALKPHOS 41  BILITOT 0.4   ------------------------------------------------------------------------------------------------------------------    RADIOLOGY:  Ct Orbits W Contrast  Result Date: 08/30/2018 CLINICAL DATA:  Left periorbital cellulitis. Left eye swelling and pain with photophobia. EXAM: CT ORBITS WITH CONTRAST TECHNIQUE: Multidetector CT images was performed according to the standard protocol following intravenous contrast administration. CONTRAST:  66mL OMNIPAQUE IOHEXOL 300 MG/ML  SOLN COMPARISON:  Brain MRI 04/05/2010 FINDINGS: Orbits: Mild left periorbital soft tissue swelling without abscess. No evidence of postseptal inflammation. Intact globes. No mass or fracture. Visualized sinuses: Paranasal sinuses and mastoid air cells are clear. Soft tissues: Left periorbital swelling mildly extends into the pre maxillary soft tissues. Limited intracranial: Cerebral white matter hypodensities most notable in the left centrum semiovale corresponding to T2/FLAIR hyperintense lesions on the prior MRI in this patient with a history of multiple sclerosis. IMPRESSION: Left periorbital soft  tissue swelling compatible with cellulitis. No evidence of postseptal cellulitis or abscess. Electronically Signed   By: Sebastian Ache M.D.   On: 08/30/2018 15:25    EKG:   Normal sinus rhythm 80 bpm, lead reversal  IMPRESSION AND PLAN:   1.  Allergic reaction.  Likely due to Bactrim.  Spoke with pharmacist to add this as an allergy.  Patient states that she has taken penicillins before so it would be less likely to be the Rocephin and/or Omnicef.  Will give steroids Pepcid and PRN Benadryl.  Would like to see eye swelling and facial redness to come down. 2.  Left facial cellulitis seen on CT scan yesterday.  Will start vancomycin. 3.  MS.  Continue usual medications 4.  Hypertension  continue lisinopril 5.  Hyperlipidemia continue Zocor 6.  Obesity with a BMI of 38.40.  Weight loss needed 7.  Hypothyroidism unspecified continue levothyroxine  All the records are reviewed and case discussed with ED provider. Management plans discussed with the patient, and she is in agreement.  Patient deferred me from calling family today states she will call when she gets into her room.  CODE STATUS: full code  TOTAL TIME TAKING CARE OF THIS PATIENT: 50 minutes.    Alford Highland M.D on 08/31/2018 at 2:10 PM  Between 7am to 6pm - Pager - [phone]  After 6pm call admission pager [phone]  [facility] Office  [phone]  CC: Primary care physician; Alvina Filbert, MD

## 2018-08-31 NOTE — Consult Note (Addendum)
Pharmacy Antibiotic Note  Katrina Murray is a 48 y.o. female admitted on 08/31/2018 with cellulitis. Pt reported eyelash caught in her eyes. She started using eyedrops and noticed swelling and redness.  She was seen recently in the ER and she was given a dose of Rocephin and discharged home on Bactrim and Omnicef. Suspected allergiv reaction to bactrim. Pharmacy has been consulted for vancomycin dosing.  BMI 38.40  Plan: Vancomycin loading dose of 2500 mg x1 dose. Will order maintenance dose of 1000 mg Q12H.  AUC goal: 400-550 Expected AUC: 511.9 Cssmin 15.2  Height: 5\' 9"  (175.3 cm) Weight: 260 lb (117.9 kg) IBW/kg (Calculated) : 66.2  Temp (24hrs), Avg:98.4 F (36.9 C), Min:98.4 F (36.9 C), Max:98.4 F (36.9 C)  Recent Labs  Lab 08/30/18 1303 08/31/18 1228  WBC 8.2 11.7*  CREATININE 0.96 1.00    Estimated Creatinine Clearance: 93.4 mL/min (by C-G formula based on SCr of 1 mg/dL).    Allergies  Allergen Reactions  . Sulfamethoxazole-Trimethoprim Swelling    Antimicrobials this admission: 07/20 Vanc >>   Microbiology results: SARS Coronavirus 2: Negative   Thank you for allowing pharmacy to be a part of this patient's care.  Rowland Lathe 08/31/2018 2:19 PM

## 2018-09-01 ENCOUNTER — Inpatient Hospital Stay: Payer: Medicaid Other

## 2018-09-01 LAB — BASIC METABOLIC PANEL
Anion gap: 10 (ref 5–15)
BUN: 15 mg/dL (ref 6–20)
CO2: 18 mmol/L — ABNORMAL LOW (ref 22–32)
Calcium: 9.2 mg/dL (ref 8.9–10.3)
Chloride: 110 mmol/L (ref 98–111)
Creatinine, Ser: 0.97 mg/dL (ref 0.44–1.00)
GFR calc Af Amer: >60 mL/min (ref >60)
GFR calc non Af Amer: >60 mL/min (ref >60)
Glucose, Bld: 142 mg/dL — ABNORMAL HIGH (ref 70–99)
Potassium: 4.1 mmol/L (ref 3.5–5.1)
Sodium: 138 mmol/L (ref 135–145)

## 2018-09-01 LAB — CBC
HCT: 46.1 % — ABNORMAL HIGH (ref 36.0–46.0)
Hemoglobin: 15 g/dL (ref 12.0–15.0)
MCH: 27.3 pg (ref 26.0–34.0)
MCHC: 32.5 g/dL (ref 30.0–36.0)
MCV: 83.8 fL (ref 80.0–100.0)
Platelets: 337 10*3/uL (ref 150–400)
RBC: 5.5 MIL/uL — ABNORMAL HIGH (ref 3.87–5.11)
RDW: 13.1 % (ref 11.5–15.5)
WBC: 14.2 10*3/uL — ABNORMAL HIGH (ref 4.0–10.5)
nRBC: 0 % (ref 0.0–0.2)

## 2018-09-01 MED ORDER — HYDROCHLOROTHIAZIDE 25 MG PO TABS: 25.0000 mg | ORAL_TABLET | Freq: Every day | ORAL | Status: DC

## 2018-09-01 MED ORDER — SODIUM CHLORIDE 0.9 % IV SOLN
INTRAVENOUS | Status: DC | PRN
  Administered 2018-09-01 – 2018-09-03 (×4): 250 mL via INTRAVENOUS

## 2018-09-01 MED ORDER — HYDROCHLOROTHIAZIDE 25 MG PO TABS
25.0000 mg | ORAL_TABLET | Freq: Every day | ORAL | Status: DC
  Administered 2018-09-01 – 2018-09-03 (×3): 25 mg via ORAL
  Filled 2018-09-01 (×3): qty 1

## 2018-09-01 NOTE — Progress Notes (Signed)
Patient has red rash/MASD bilaterally on breasts. States it happens every summer with the heat, powder applied to dry up. Will continue to monitor. Stacie Glaze, RN

## 2018-09-01 NOTE — Progress Notes (Signed)
[facility] - [place] at [facility]   PATIENT NAME: Katrina CitrinMelanie Murray    MR#:  [id number]  DATE OF BIRTH:  Apr 05, 1970  SUBJECTIVE:  CHIEF COMPLAINT:   Chief Complaint  Patient presents with  . Allergic Reaction   Patient admitted with left facial cellulitis and allergic reaction to Bactrim.  Also complained of feeling some left facial droop this morning.  Moving all extremities with no focal deficits.  Stat CT head recommended.  REVIEW OF SYSTEMS:  ROS CONSTITUTIONAL: No fever, chills or sweats.  No fatigue or weakness.  EYES: With eyelid swollen shut she has decreased vision EARS, NOSE, AND THROAT: No tinnitus or ear pain. No sore throat RESPIRATORY: No cough, shortness of breath, wheezing or hemoptysis.  CARDIOVASCULAR: No chest pain, orthopnea, edema.  GASTROINTESTINAL: No nausea, vomiting, diarrhea or abdominal pain. No blood in bowel movements GENITOURINARY: No dysuria, hematuria.  ENDOCRINE: No polyuria, nocturia,  HEMATOLOGY: No anemia, easy bruising or bleeding SKIN: No rash or lesion. MUSCULOSKELETAL:  No joint pains.  No muscle weakness.  Feels she has slight left facial droop. NEUROLOGIC: History of muscle weakness with numbness.  Feels she has some left facial droop. PSYCHIATRY: No anxiety or depression.   DRUG ALLERGIES:   Allergies  Allergen Reactions  . Sulfamethoxazole-Trimethoprim Swelling   VITALS:  Blood pressure (!) 157/100, pulse 67, temperature 98.1 F (36.7 C), temperature source Oral, resp. rate 16, height 5\' 9"  (1.753 m), weight 123.2 kg, SpO2 95 %. PHYSICAL EXAMINATION:  Physical Exam  GENERAL:  48 y.o.-year-old patient lying in the bed with no acute distress.  EYES: Barely able to open up the eyes because the eyelids are swollen so much. HEENT: Head atraumatic, normocephalic. Oropharynx and nasopharynx clear.  NECK:  Supple, no jugular venous distention. No thyroid enlargement, no tenderness.  LUNGS: Normal breath sounds  bilaterally, no wheezing, rales,rhonchi or crepitation. No use of accessory muscles of respiration.  CARDIOVASCULAR: S1, S2 normal. No murmurs, rubs, or gallops.  ABDOMEN: Soft, nontender, nondistended. Bowel sounds present. No organomegaly or mass.  EXTREMITIES: No pedal edema, cyanosis, or clubbing.  NEUROLOGIC: Cranial nerves II through XII are intact. Muscle strength 5/5 in all extremities. Sensation intact. Gait not checked.  PSYCHIATRIC: The patient is alert and oriented x 3.  SKIN: Erythema the entire face with swollen eyelids.  No erythema on the left side.  LABORATORY PANEL:  Female CBC Recent Labs  Lab 09/01/18 0440  WBC 14.2*  HGB 15.0  HCT 46.1*  PLT 337   ------------------------------------------------------------------------------------------------------------------ Chemistries  Recent Labs  Lab 08/31/18 1228 09/01/18 0440  NA 138 138  K 4.0 4.1  CL 109 110  CO2 20* 18*  GLUCOSE 126* 142*  BUN 14 15  CREATININE 1.00 0.97  CALCIUM 9.2 9.2  AST 18  --   ALT 22  --   ALKPHOS 41  --   BILITOT 0.4  --    RADIOLOGY:  No results found. ASSESSMENT AND PLAN:   1.  Allergic reaction.  Likely due to Bactrim; already added to list of allergies. Patient on steroid with Solu-Medrol.  Pepcid and PRN Benadryl. monitor   2.  Left facial cellulitis seen on CT scan yesterday.  Patient currently on vancomycin.  Patient reports having some left facial droop.  Moving all extremities with no focal deficit.  Most likely related to facial swelling from cellulitis Requested for stat CT head. 3.  MS.  Continue usual medications 4.  Hypertension continue lisinopril 5.  Hyperlipidemia continue  Zocor 6.  Obesity with a BMI of 38.40.  Weight loss needed 7.  Hypothyroidism unspecified continue levothyroxine  DVT prophylaxis; Lovenox  All the records are reviewed and case discussed with Care Management/Social Worker. Management plans discussed with the patient, family and they  are in agreement.  CODE STATUS: Full Code  TOTAL TIME TAKING CARE OF THIS PATIENT: 38 minutes.   More than 50% of the time was spent in counseling/coordination of care: YES  POSSIBLE D/C IN 2 DAYS, DEPENDING ON CLINICAL CONDITION.   Facundo Allemand M.D on 09/01/2018 at 2:30 PM  Between 7am to 6pm - Pager - [phone]  After 6pm go to [email or web address] - Proofreader  [facility]  Office  [phone]  CC: Primary care physician; Abran Richard, MD  Note: This dictation was prepared with Dragon dictation along with smaller phrase technology. Any transcriptional errors that result from this process are unintentional.

## 2018-09-02 LAB — CBC
HCT: 47.5 % — ABNORMAL HIGH (ref 36.0–46.0)
Hemoglobin: 15.5 g/dL — ABNORMAL HIGH (ref 12.0–15.0)
MCH: 27.8 pg (ref 26.0–34.0)
MCHC: 32.6 g/dL (ref 30.0–36.0)
MCV: 85.1 fL (ref 80.0–100.0)
Platelets: 342 10*3/uL (ref 150–400)
RBC: 5.58 MIL/uL — ABNORMAL HIGH (ref 3.87–5.11)
RDW: 13.3 % (ref 11.5–15.5)
WBC: 13.9 10*3/uL — ABNORMAL HIGH (ref 4.0–10.5)
nRBC: 0 % (ref 0.0–0.2)

## 2018-09-02 LAB — BASIC METABOLIC PANEL
Anion gap: 9 (ref 5–15)
BUN: 19 mg/dL (ref 6–20)
CO2: 21 mmol/L — ABNORMAL LOW (ref 22–32)
Calcium: 9.4 mg/dL (ref 8.9–10.3)
Chloride: 108 mmol/L (ref 98–111)
Creatinine, Ser: 0.99 mg/dL (ref 0.44–1.00)
GFR calc Af Amer: >60 mL/min (ref >60)
GFR calc non Af Amer: >60 mL/min (ref >60)
Glucose, Bld: 128 mg/dL — ABNORMAL HIGH (ref 70–99)
Potassium: 4.1 mmol/L (ref 3.5–5.1)
Sodium: 138 mmol/L (ref 135–145)

## 2018-09-02 LAB — VANCOMYCIN, PEAK: Vancomycin Pk: 30 ug/mL (ref 30–40)

## 2018-09-02 LAB — MAGNESIUM: Magnesium: 2.2 mg/dL (ref 1.7–2.4)

## 2018-09-02 MED ORDER — SODIUM CHLORIDE 0.9% FLUSH
10.0000 mL | Freq: Two times a day (BID) | INTRAVENOUS | Status: DC
  Administered 2018-09-02: 21:00:00 10 mL
  Administered 2018-09-03: 09:00:00 20 mL

## 2018-09-02 MED ORDER — SODIUM CHLORIDE 0.9% FLUSH: 10.0000 mL | INTRAVENOUS | Status: DC | PRN

## 2018-09-02 NOTE — Progress Notes (Signed)
[place] [facility] [place] NAME: Anina Schnake    MR#:  [id number]  DATE OF BIRTH:  [birth year]  SUBJECTIVE:  CHIEF COMPLAINT:   Chief Complaint  Patient presents with  . Allergic Reaction   Patient admitted with left facial cellulitis and allergic reaction to Bactrim.  Reports significant improvement already.  No fevers overnight.   REVIEW OF SYSTEMS:  ROS CONSTITUTIONAL: No fever, chills or sweats.  No fatigue or weakness.  EYES: With eyelid swollen shut she has decreased vision EARS, NOSE, AND THROAT: No tinnitus or ear pain. No sore throat RESPIRATORY: No cough, shortness of breath, wheezing or hemoptysis.  CARDIOVASCULAR: No chest pain, orthopnea, edema.  GASTROINTESTINAL: No nausea, vomiting, diarrhea or abdominal pain. No blood in bowel movements GENITOURINARY: No dysuria, hematuria.  ENDOCRINE: No polyuria, nocturia,  HEMATOLOGY: No anemia, easy bruising or bleeding SKIN: No rash or lesion. MUSCULOSKELETAL:  No joint pains.  No muscle weakness.  Feels she has slight left facial droop. NEUROLOGIC: History of muscle weakness with numbness.  Feels she has some left facial droop. PSYCHIATRY: No anxiety or depression.   DRUG ALLERGIES:   Allergies  Allergen Reactions  . Sulfamethoxazole-Trimethoprim Swelling   VITALS:  Blood pressure (!) 152/96, pulse 88, temperature 98.1 F (36.7 C), temperature source Oral, resp. rate 16, height 5\' 9"  (1.753 m), weight 123.2 kg, SpO2 97 %. PHYSICAL EXAMINATION:  Physical Exam  GENERAL:  48 y.o.-year-old patient lying in the bed with no acute distress.  EYES: Barely able to open up the eyes because the eyelids are swollen so much. HEENT: Head atraumatic, normocephalic. Oropharynx and nasopharynx clear.  NECK:  Supple, no jugular venous distention. No thyroid enlargement, no tenderness.  LUNGS: Normal breath sounds bilaterally, no wheezing, rales,rhonchi or crepitation. No use of accessory  muscles of respiration.  CARDIOVASCULAR: S1, S2 normal. No murmurs, rubs, or gallops.  ABDOMEN: Soft, nontender, nondistended. Bowel sounds present. No organomegaly or mass.  EXTREMITIES: No pedal edema, cyanosis, or clubbing.  NEUROLOGIC: Cranial nerves II through XII are intact. Muscle strength 5/5 in all extremities. Sensation intact. Gait not checked.  PSYCHIATRIC: The patient is alert and oriented x 3.  SKIN: Erythema the entire face with swollen eyelids with significant improvement today.Marland Kitchen  No erythema on the left side.  LABORATORY PANEL:  Female CBC Recent Labs  Lab 09/02/18 0424  WBC 13.9*  HGB 15.5*  HCT 47.5*  PLT 342   ------------------------------------------------------------------------------------------------------------------ Chemistries  Recent Labs  Lab 08/31/18 1228  09/02/18 0424  NA 138   < > 138  K 4.0   < > 4.1  CL 109   < > 108  CO2 20*   < > 21*  GLUCOSE 126*   < > 128*  BUN 14   < > 19  CREATININE 1.00   < > 0.99  CALCIUM 9.2   < > 9.4  MG  --   --  2.2  AST 18  --   --   ALT 22  --   --   ALKPHOS 41  --   --   BILITOT 0.4  --   --    < > = values in this interval not displayed.   RADIOLOGY:  Ct Head Wo Contrast  Result Date: 09/01/2018 CLINICAL DATA:  TIA. Left facial droop. History of multiple sclerosis. EXAM: CT HEAD WITHOUT CONTRAST TECHNIQUE: Contiguous axial images were obtained from the base of the skull through the vertex without intravenous contrast.  COMPARISON:  CT orbits 08/30/2018. MRI head 04/05/2010. FINDINGS: Brain: No acute large territory infarct, intracranial hemorrhage, mass, midline shift, or extra-axial fluid collection is identified. The ventricles and sulci are normal. Patchy cerebral white matter hypodensities correspond to T2 FLAIR hyperintensities on the prior MRI, most notably in the left centrum semiovale. A dilated perivascular space is noted inferiorly in the left lentiform nucleus. Vascular: No hyperdense vessel.  Skull: No fracture or focal osseous lesion. Sinuses/Orbits: Visualized paranasal sinuses and mastoid air cells are clear. Periorbital soft tissue swelling is partially visualized and was more fully evaluated on the recent prior CT of the orbits. Other: None. IMPRESSION: 1. No evidence of acute intracranial abnormality. 2. Chronic white matter disease with history of multiple sclerosis. Electronically Signed   By: Sebastian Ache M.D.   On: 09/01/2018 15:30   ASSESSMENT AND PLAN:   1.  Allergic reaction.  Likely due to Bactrim; already added to list of allergies. Patient on steroid with Solu-Medrol with significant improvement today..  Pepcid and PRN Benadryl. monitor   2.  Left facial cellulitis seen on CT scan yesterday.  Patient currently on vancomycin with significant improvement.  Patient reports having some left facial droop previously.  Resolved with improvement in facial cellulitis.  CT scan of the head without contrast was negative for any acute findings. Continue IV antibiotics for at least 1 more day  3.  MS.  Continue usual medications 4.  Hypertension continue lisinopril 5.  Hyperlipidemia continue Zocor 6.  Obesity with a BMI of 38.40.  Weight loss needed 7.  Hypothyroidism unspecified continue levothyroxine  DVT prophylaxis; Lovenox  All the records are reviewed and case discussed with Care Management/Social Worker. Management plans discussed with the patient, and she is in agreement.  CODE STATUS: Full Code  TOTAL TIME TAKING CARE OF THIS PATIENT: 35 minutes.   More than 50% of the time was spent in counseling/coordination of care: YES  POSSIBLE D/C IN 1-2 DAYS, DEPENDING ON CLINICAL CONDITION.   Alonda Weaber M.D on 09/02/2018 at 1:22 PM  Between 7am to 6pm - Pager - [phone]  After 6pm go to [email or web address] - Social research officer, government  [facility] [place] [facility]  Office  [phone]  CC: Primary care physician; Alvina Filbert, MD  Note: This dictation was  prepared with Dragon dictation along with smaller phrase technology. Any transcriptional errors that result from this process are unintentional.

## 2018-09-03 LAB — VANCOMYCIN, TROUGH: Vancomycin Tr: 13 ug/mL — ABNORMAL LOW (ref 15–20)

## 2018-09-03 MED ORDER — HYDROCHLOROTHIAZIDE 25 MG PO TABS: 25.0000 mg | ORAL_TABLET | Freq: Every day | ORAL | 0 refills | Status: AC

## 2018-09-03 MED ORDER — VANCOMYCIN HCL IN DEXTROSE 750-5 MG/150ML-% IV SOLN
750.0000 mg | Freq: Two times a day (BID) | INTRAVENOUS | Status: DC
  Administered 2018-09-03: 750 mg via INTRAVENOUS
  Filled 2018-09-03 (×3): qty 150

## 2018-09-03 MED ORDER — METHYLPREDNISOLONE SODIUM SUCC 40 MG IJ SOLR
20.0000 mg | Freq: Two times a day (BID) | INTRAMUSCULAR | Status: DC
  Administered 2018-09-03: 09:00:00 20 mg via INTRAVENOUS
  Filled 2018-09-03: qty 1

## 2018-09-03 MED ORDER — DOXYCYCLINE MONOHYDRATE 100 MG PO TABS: 100.0000 mg | ORAL_TABLET | Freq: Two times a day (BID) | ORAL | 0 refills | Status: AC

## 2018-09-03 MED ORDER — PREDNISONE 10 MG PO TABS: ORAL_TABLET | ORAL | 0 refills | Status: AC

## 2018-09-03 MED ORDER — FAMOTIDINE 20 MG PO TABS: 20.0000 mg | ORAL_TABLET | Freq: Two times a day (BID) | ORAL | 0 refills | Status: DC

## 2018-09-03 NOTE — Discharge Summary (Signed)
[facility] - [place] at [facility]   PATIENT NAME: Katrina Murray    MR#:  [id number]  DATE OF BIRTH:  11-21-1970  DATE OF ADMISSION:  08/31/2018   ADMITTING PHYSICIAN: Alford Highlandichard Wieting, MD  DATE OF DISCHARGE: 09/03/2018  PRIMARY CARE PHYSICIAN: Alvina FilbertHunter, Denise, MD   ADMISSION DIAGNOSIS:  Allergic reaction, initial encounter [T78.40XA] Angioedema, initial encounter [T78.3XXA] DISCHARGE DIAGNOSIS:  Active Problems:   Allergic reaction caused by a drug  SECONDARY DIAGNOSIS:   Past Medical History:  Diagnosis Date  . Gastritis, chronic   . GERD (gastroesophageal reflux disease)   . Hypercholesteremia   . Hyperlipidemia   . Hypertension   . Hypothyroidism   . Multiple sclerosis [facility](HCC)    HOSPITAL COURSE:  Chief complaint; allergic reaction  History of presenting complaint; Katrina Murray  is a 48 y.o. female coming in after an eyelash got caught in her eye.  She started using some eyedrops she woke up and her face was swollen on the left side and red.  She went to the urgent care and was referred into the ER.  She was given a dose of Rocephin and discharged home on Bactrim and Omnicef.  She took those medications and then her face started swelling and her eyes were swollen shut bilaterally.  Patient reported to have had allergic reaction to Bactrim and developed left facial cellulitis.  Was admitted to medical service for further evaluation.  Hospital course; 1. Allergic reaction. Likely due to Bactrim; already added to list of allergies. Patient on steroid with Solu-Medrol with significant improvement today..  Pepcid and PRN Benadryl.  Significantly improved clinically.  Being discharged on p.o. prednisone taper for a few days to prevent rebound as well as Pepcid.  Patient stable on hemodynamically stable for discharge.  2. Left facial cellulitis seen on CT scan on admission Patient currently on vancomycin with significant improvement.  Patient reports  having some left facial droop previously.  Resolved with improvement in facial cellulitis.  CT scan of the head without contrast was negative for any acute findings.  Patient noted to have significant improvement with IV vancomycin already.  Being discharged on p.o. doxycycline for few more days to complete treatment duration.  Follow-up with primary care physician 3. MS. Continue usual medications 4. Hypertension; blood pressure was uncontrolled.  Added hydrochlorothiazide to lisinopril.  Outpatient monitoring by primary care physician.   5. Hyperlipidemia continue Zocor 6. Obesity with a BMI of 38.40. Weight loss needed 7. Hypothyroidism unspecified continue levothyroxine   DISCHARGE CONDITIONS:  Stable CONSULTS OBTAINED:   DRUG ALLERGIES:   Allergies  Allergen Reactions  . Sulfamethoxazole-Trimethoprim Swelling   DISCHARGE MEDICATIONS:   Allergies as of 09/03/2018      Reactions   Sulfamethoxazole-trimethoprim Swelling      Medication List    STOP taking these medications   cefdinir 300 MG capsule Commonly known as: OMNICEF     TAKE these medications   doxycycline 100 MG tablet Commonly known as: ADOXA Take 1 tablet (100 mg total) by mouth 2 (two) times daily for 5 days.   erythromycin ophthalmic ointment Place 1 application into the left eye at bedtime for 7 days.   famotidine 20 MG tablet Commonly known as: PEPCID Take 1 tablet (20 mg total) by mouth 2 (two) times daily for 5 days.   hydrochlorothiazide 25 MG tablet Commonly known as: HYDRODIURIL Take 1 tablet (25 mg total) by mouth daily. Start taking on: September 04, 2018   levothyroxine 25 MCG  tablet Commonly known as: SYNTHROID Take 25 mcg by mouth daily.   lisinopril 40 MG tablet Commonly known as: ZESTRIL Take 40 mg by mouth every evening.   mirabegron ER 50 MG Tb24 tablet Commonly known as: MYRBETRIQ Take 1 tablet (50 mg total) by mouth daily.   pantoprazole 40 MG tablet Commonly known as:  PROTONIX Take 40 mg by mouth every evening.   predniSONE 10 MG tablet Commonly known as: DELTASONE Prednisone 40 mg p.o. daily x1 day Then 30 mg p.o. daily x1 day Then 20 mg p.o. daily x1 day Then 10 mg p.o. daily x1 day   simvastatin 40 MG tablet Commonly known as: ZOCOR Take 40 mg by mouth every evening.   Tecfidera 240 MG Cpdr Generic drug: Dimethyl Fumarate Take 240 mg by mouth 2 (two) times daily.   topiramate 100 MG tablet Commonly known as: TOPAMAX Take 100 mg by mouth at bedtime.   traMADol 50 MG tablet Commonly known as: Ultram Take 1 tablet (50 mg total) by mouth every 6 (six) hours as needed.            Discharge Care Instructions  (From admission, onward)         Start     Ordered   09/03/18 0000  Discharge wound care:     09/03/18 1257           DISCHARGE INSTRUCTIONS:   DIET:  Cardiac diet DISCHARGE CONDITION:  Stable ACTIVITY:  Activity as tolerated OXYGEN:  Home Oxygen: No.  Oxygen Delivery: room air DISCHARGE LOCATION:  home   If you experience worsening of your admission symptoms, develop shortness of breath, life threatening emergency, suicidal or homicidal thoughts you must seek medical attention immediately by calling 911 or calling your MD immediately  if symptoms less severe.  You Must read complete instructions/literature along with all the possible adverse reactions/side effects for all the Medicines you take and that have been prescribed to you. Take any new Medicines after you have completely understood and accpet all the possible adverse reactions/side effects.   Please note  You were cared for by a hospitalist during your hospital stay. If you have any questions about your discharge medications or the care you received while you were in the hospital after you are discharged, you can call the unit and asked to speak with the hospitalist on call if the hospitalist that took care of you is not available. Once you are  discharged, your primary care physician will handle any further medical issues. Please note that NO REFILLS for any discharge medications will be authorized once you are discharged, as it is imperative that you return to your primary care physician (or establish a relationship with a primary care physician if you do not have one) for your aftercare needs so that they can reassess your need for medications and monitor your lab values.    On the day of Discharge:  VITAL SIGNS:  Blood pressure (!) 139/95, pulse 75, temperature 98.2 F (36.8 C), temperature source Oral, resp. rate 18, height 5\' 9"  (1.753 m), weight 123.2 kg, SpO2 97 %. PHYSICAL EXAMINATION:  GENERAL:  48 y.o.-year-old patient lying in the bed with no acute distress.  EYES: Pupils equal, round, reactive to light and accommodation. No scleral icterus. Extraocular muscles intact.  HEENT: Head atraumatic, normocephalic. Oropharynx and nasopharynx clear.  NECK:  Supple, no jugular venous distention. No thyroid enlargement, no tenderness.  LUNGS: Normal breath sounds bilaterally, no wheezing, rales,rhonchi or crepitation. No  use of accessory muscles of respiration.  CARDIOVASCULAR: S1, S2 normal. No murmurs, rubs, or gallops.  ABDOMEN: Soft, non-tender, non-distended. Bowel sounds present. No organomegaly or mass.  EXTREMITIES: No pedal edema, cyanosis, or clubbing.  NEUROLOGIC: Cranial nerves II through XII are intact. Muscle strength 5/5 in all extremities. Sensation intact. Gait not checked.  PSYCHIATRIC: The patient is alert and oriented x 3.  SKIN: Erythema the entire face with swollen eyelids significantly improved.  DATA REVIEW:   CBC Recent Labs  Lab 09/02/18 0424  WBC 13.9*  HGB 15.5*  HCT 47.5*  PLT 342    Chemistries  Recent Labs  Lab 08/31/18 1228  09/02/18 0424  NA 138   < > 138  K 4.0   < > 4.1  CL 109   < > 108  CO2 20*   < > 21*  GLUCOSE 126*   < > 128*  BUN 14   < > 19  CREATININE 1.00   < > 0.99   CALCIUM 9.2   < > 9.4  MG  --   --  2.2  AST 18  --   --   ALT 22  --   --   ALKPHOS 41  --   --   BILITOT 0.4  --   --    < > = values in this interval not displayed.     Microbiology Results  Results for orders placed or performed during the hospital encounter of 08/31/18  SARS Coronavirus 2 (CEPHEID - Performed in [place] hospital lab), Hosp Order     Status: None   Collection Time: 08/31/18 12:11 PM   Specimen: Nasopharyngeal Swab  Result Value Ref Range Status   SARS Coronavirus 2 NEGATIVE NEGATIVE Final    Comment: (NOTE) If result is NEGATIVE SARS-CoV-2 target nucleic acids are NOT DETECTED. The SARS-CoV-2 RNA is generally detectable in upper and lower  respiratory specimens during the acute phase of infection. The lowest  concentration of SARS-CoV-2 viral copies this assay can detect is 250  copies / mL. A negative result does not preclude SARS-CoV-2 infection  and should not be used as the sole basis for treatment or other  patient management decisions.  A negative result may occur with  improper specimen collection / handling, submission of specimen other  than nasopharyngeal swab, presence of viral mutation(s) within the  areas targeted by this assay, and inadequate number of viral copies  (<250 copies / mL). A negative result must be combined with clinical  observations, patient history, and epidemiological information. If result is POSITIVE SARS-CoV-2 target nucleic acids are DETECTED. The SARS-CoV-2 RNA is generally detectable in upper and lower  respiratory specimens dur ing the acute phase of infection.  Positive  results are indicative of active infection with SARS-CoV-2.  Clinical  correlation with patient history and other diagnostic information is  necessary to determine patient infection status.  Positive results do  not rule out bacterial infection or co-infection with other viruses. If result is PRESUMPTIVE POSTIVE SARS-CoV-2 nucleic acids MAY BE  PRESENT.   A presumptive positive result was obtained on the submitted specimen  and confirmed on repeat testing.  While 2019 novel coronavirus  (SARS-CoV-2) nucleic acids may be present in the submitted sample  additional confirmatory testing may be necessary for epidemiological  and / or clinical management purposes  to differentiate between  SARS-CoV-2 and other Sarbecovirus currently known to infect humans.  If clinically indicated additional testing with an alternate test  methodology [phone]([id number]) is advised. The SARS-CoV-2 RNA is generally  detectable in upper and lower respiratory sp ecimens during the acute  phase of infection. The expected result is Negative. Fact Sheet for Patients:  BoilerBrush.com.cyhttps://www.fda.gov/media/136312/download Fact Sheet for Healthcare Providers: https://pope.com/https://www.fda.gov/media/136313/download This test is not yet approved or cleared by the [place] FDA and has been authorized for detection and/or diagnosis of SARS-CoV-2 by FDA under an Emergency Use Authorization (EUA).  This EUA will remain in effect (meaning this test can be used) for the duration of the COVID-19 declaration under Section 564(b)(1) of the Act, 21 U.S.C. section 360bbb-3(b)(1), unless the authorization is terminated or revoked sooner. Performed at [facility], [address] [place], [place] [id number]     RADIOLOGY:  No results found.   Management plans discussed with the patient, family and they are in agreement.  CODE STATUS: Full Code   TOTAL TIME TAKING CARE OF THIS PATIENT: 38 minutes.    Marylen Zuk M.D on 09/03/2018 at 12:58 PM  Between 7am to 6pm - Pager - [phone]  After 6pm go to [email or web address] - Social research officer, governmentpassword EPAS [facility]  [facility] [place] [facility]  Office  [phone]  CC: Primary care physician; Alvina FilbertHunter, Denise, MD   Note: This dictation was prepared with Dragon dictation along with smaller phrase technology. Any transcriptional errors that result  from this process are unintentional.

## 2018-09-03 NOTE — Plan of Care (Signed)

## 2018-09-03 NOTE — Consult Note (Signed)
Pharmacy Antibiotic Note  CHONTE RICKE is a 48 y.o. female admitted on 08/31/2018 with cellulitis. Pt reported eyelash caught in her eyes. She started using eyedrops and noticed swelling and redness.  She was seen recently in the ER and she was given a dose of Rocephin and discharged home on Bactrim and Omnicef. Suspected allergic reaction to bactrim. Pharmacy has been consulted for vancomycin dosing.  BMI >40  7/22 @ 1930 Vanc Pk: 30  7/23 @ 0324 Vanc Tr: 13  Current vancomycin regimen: 1000mg  IV every 12 hours   Plan: 7/23: Actual Peak: 40 mcg/ml, Actual Trough: 12.5 mcg/ml  Calculated AUC: 572.8.   Will adjust vancomycin dose to 750mg  IV every 12 hours  New Calculated AUC: 429   Height: 5\' 9"  (175.3 cm) Weight: 271 lb 9.6 oz (123.2 kg) IBW/kg (Calculated) : 66.2  Temp (24hrs), Avg:98.3 F (36.8 C), Min:98 F (36.7 C), Max:98.7 F (37.1 C)  Recent Labs  Lab 08/30/18 1303 08/31/18 1228 09/01/18 0440 09/02/18 0424 09/02/18 1930 09/03/18 0324  WBC 8.2 11.7* 14.2* 13.9*  --   --   CREATININE 0.96 1.00 0.97 0.99  --   --   VANCOTROUGH  --   --   --   --   --  13*  VANCOPEAK  --   --   --   --  30  --     Estimated Creatinine Clearance: 96.6 mL/min (by C-G formula based on SCr of 0.99 mg/dL).    Allergies  Allergen Reactions  . Sulfamethoxazole-Trimethoprim Swelling    Antimicrobials this admission: 07/20 Vanc >>   Microbiology results: SARS Coronavirus 2: Negative   Thank you for allowing pharmacy to be a part of this patient's care.  Pernell Dupre, PharmD, BCPS Clinical Pharmacist 09/03/2018 4:11 AM

## 2018-09-03 NOTE — Plan of Care (Signed)

## 2018-11-17 ENCOUNTER — Other Ambulatory Visit: Payer: Self-pay | Admitting: Neurology

## 2018-11-17 DIAGNOSIS — G35 Multiple sclerosis: Secondary | ICD-10-CM

## 2018-11-30 ENCOUNTER — Other Ambulatory Visit: Payer: Self-pay

## 2018-11-30 ENCOUNTER — Ambulatory Visit
Admission: RE | Admit: 2018-11-30 | Discharge: 2018-11-30 | Disposition: A | Discharge status: Home / Self Care | Payer: Medicaid Other | Source: Ambulatory Visit | Attending: Neurology | Admitting: Neurology

## 2018-11-30 DIAGNOSIS — G35 Multiple sclerosis: Secondary | ICD-10-CM | POA: Insufficient documentation

## 2018-11-30 LAB — POCT I-STAT CREATININE: Creatinine, Ser: 1.2 mg/dL — ABNORMAL HIGH (ref 0.44–1.00)

## 2018-11-30 MED ORDER — GADOBUTROL 1 MMOL/ML IV SOLN
10.0000 mL | Freq: Once | INTRAVENOUS | Status: AC | PRN
  Administered 2018-11-30: 11:00:00 10 mL via INTRAVENOUS

## 2019-02-01 ENCOUNTER — Ambulatory Visit (INDEPENDENT_AMBULATORY_CARE_PROVIDER_SITE_OTHER): Payer: Medicaid Other | Admitting: Urology

## 2019-02-01 ENCOUNTER — Other Ambulatory Visit: Payer: Self-pay

## 2019-02-01 ENCOUNTER — Encounter: Payer: Self-pay | Admitting: Urology

## 2019-02-01 VITALS — BP 138/76 | HR 88 | Ht 69.0 in | Wt 271.0 lb

## 2019-02-01 DIAGNOSIS — N3946 Mixed incontinence: Secondary | ICD-10-CM | POA: Diagnosis not present

## 2019-02-01 LAB — URINALYSIS, COMPLETE
Bilirubin, UA: NEGATIVE
Glucose, UA: NEGATIVE
Ketones, UA: NEGATIVE
Leukocytes,UA: NEGATIVE
Nitrite, UA: NEGATIVE
Protein,UA: NEGATIVE
Specific Gravity, UA: <1.005 — ABNORMAL LOW (ref 1.005–1.030)
Urobilinogen, Ur: 0.2 mg/dL (ref 0.2–1.0)
pH, UA: 5.5 (ref 5.0–7.5)

## 2019-02-01 LAB — MICROSCOPIC EXAMINATION
Bacteria, UA: NONE SEEN
RBC, Urine: NONE SEEN /hpf (ref 0–2)
WBC, UA: NONE SEEN /hpf (ref 0–5)

## 2019-02-01 NOTE — Addendum Note (Signed)
Addended by: Verlene Mayer A on: 02/01/2019 10:48 AM   Modules accepted: Orders

## 2019-02-01 NOTE — Progress Notes (Signed)
02/01/2019 10:32 AM   Katrina Murray 1970/08/24 [id number]  Referring provider: Alvina Filbert, MD [address] [place],  [place] [address]  Chief Complaint  Patient presents with  . Urinary Incontinence    HPI: Dictated along note 1 year ago. The patient has a mild outlet abnormality and by history has urge incontinence and bedwetting and likely has a neurogenic bladder. 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  In my opinion she should not have a sling and would be at very high risk of long-term urinary retention. a urethral injectable would be a better option with the rare risk of her retention.  Urge incontinence was better on Vesicare as a partial responder.She would rather not take medication but I mentioned that because of the multiple sclerosis there can be issues or contraindications for some of the refractory therapies and she is young for percutaneous tibial nerve stimulation's from an insurance standpoint and it may not be covered by Medicaid.  She is failed oxybutynin with nighttime bedwetting wearing 2 pads. She really thinks the Myrbetriq helped a lot. She is failed solifenacin. Clinically not infected.  Apparently Medicaid will not pay for the medication and it makes her at least 50% better. She still has stress incontinence but her primary problem is urge incontinence and bedwetting.  We talked about 3 refractory therapies in detail with my usual template. Handouts given. She understands she should not have a sling. She understood the MRI issue with choices for the InterStim device. The Medtronic newer device is coming out soon as well.  The patient was hoping to become dry. She was upset today with the imperfect options. I certainly can understand things for her. I gave her 3 months of samples and reassess in 10 weeks. 3 handouts given. We will proceed  accordingly.  Incontinence improved on Myrbetriq but she said Medicaid does not cover it.  She is chose watchful waiting.  She definitely does not want Botox.  She does not want InterStim at this stage.  Her fianc is in the hospital.  Clinically not infected today  Today Frequency stable Incontinence and small-volume voiding and post void incontinence continues  She gets a lot of nonspecific complaints any the subcostal margin sometimes in the suprapubic area.  Sometimes the discomfort is significant and comes and goes for many months.  Modifying factors: There are no other modifying factors  Associated signs and symptoms: There are no other associated signs and symptoms Aggravating and relieving factors: There are no other aggravating or relieving factors Severity: Moderate Duration: Persistent  Role of CT scan discussed but she had a normal CT scan in January 2020 and this will not be repeated  3 months of samples given    PMH: Past Medical History:  Diagnosis Date  . Gastritis, chronic   . GERD (gastroesophageal reflux disease)   . Hypercholesteremia   . Hyperlipidemia   . Hypertension   . Hypothyroidism   . Multiple sclerosis [facility])     Surgical History: Past Surgical History:  Procedure Laterality Date  . ABDOMINAL HYSTERECTOMY    . COLONOSCOPY WITH PROPOFOL N/A 01/29/2017   Procedure: COLONOSCOPY WITH PROPOFOL;  Surgeon: Toledo, Boykin Nearing, MD;  Location: [facility] ENDOSCOPY;  Service: Gastroenterology;  Laterality: N/A;  . ESOPHAGOGASTRODUODENOSCOPY (EGD) WITH PROPOFOL N/A 07/12/2016   Procedure: ESOPHAGOGASTRODUODENOSCOPY (EGD) WITH PROPOFOL;  Surgeon: Christena Deem, MD;  Location: [facility] ENDOSCOPY;  Service: Endoscopy;  Laterality:  N/A;  . PARTIAL HYSTERECTOMY    . TUBAL LIGATION      Home Medications:  Allergies as of 02/01/2019      Reactions   Sulfamethoxazole-trimethoprim Swelling      Medication List       Accurate as of February 01, 2019 10:32 AM. If  you have any questions, ask your nurse or doctor.        STOP taking these medications   famotidine 20 MG tablet Commonly known as: PEPCID Stopped by: Martina Sinner, MD     TAKE these medications   hydrochlorothiazide 25 MG tablet Commonly known as: HYDRODIURIL Take 1 tablet (25 mg total) by mouth daily.   levothyroxine 25 MCG tablet Commonly known as: SYNTHROID Take 25 mcg by mouth daily.   lisinopril 40 MG tablet Commonly known as: ZESTRIL Take 40 mg by mouth every evening.   mirabegron ER 50 MG Tb24 tablet Commonly known as: MYRBETRIQ Take 1 tablet (50 mg total) by mouth daily.   pantoprazole 40 MG tablet Commonly known as: PROTONIX Take 40 mg by mouth every evening.   predniSONE 10 MG tablet Commonly known as: DELTASONE Prednisone 40 mg p.o. daily x1 day Then 30 mg p.o. daily x1 day Then 20 mg p.o. daily x1 day Then 10 mg p.o. daily x1 day   simvastatin 40 MG tablet Commonly known as: ZOCOR Take 40 mg by mouth every evening.   Tecfidera 240 MG Cpdr Generic drug: Dimethyl Fumarate Take 240 mg by mouth 2 (two) times daily.   topiramate 100 MG tablet Commonly known as: TOPAMAX Take 100 mg by mouth at bedtime.   traMADol 50 MG tablet Commonly known as: Ultram Take 1 tablet (50 mg total) by mouth every 6 (six) hours as needed.       Allergies:  Allergies  Allergen Reactions  . Sulfamethoxazole-Trimethoprim Swelling    Family History: Family History  Problem Relation Age of Onset  . Breast cancer Maternal Aunt   . Heart failure Mother   . CAD Father   . CAD Brother   . Heart failure Brother   . Bladder Cancer Neg Hx   . Kidney cancer Neg Hx     Social History:  reports that she quit smoking about 12 years ago. She has never used smokeless tobacco. She reports that she does not drink alcohol or use drugs.  ROS: UROLOGY Frequent Urination?: Yes Hard to postpone urination?: No Burning/pain with urination?: No Get up at night to  urinate?: Yes Leakage of urine?: Yes Urine stream starts and stops?: No Trouble starting stream?: No Do you have to strain to urinate?: No Blood in urine?: No Urinary tract infection?: No Sexually transmitted disease?: No Injury to kidneys or bladder?: No Painful intercourse?: No Weak stream?: Yes Currently pregnant?: No Vaginal bleeding?: No Last menstrual period?: N  Gastrointestinal Nausea?: No Vomiting?: No Indigestion/heartburn?: No Diarrhea?: No Constipation?: No  Constitutional Fever: No Night sweats?: No Weight loss?: No Fatigue?: No  Skin Skin rash/lesions?: No Itching?: No  Eyes Blurred vision?: No Double vision?: No  Ears/Nose/Throat Sore throat?: No Sinus problems?: No  Hematologic/Lymphatic Swollen glands?: No Easy bruising?: No  Cardiovascular Leg swelling?: No Chest pain?: No  Respiratory Cough?: No Shortness of breath?: No  Endocrine Excessive thirst?: No  Musculoskeletal Back pain?: No Joint pain?: No  Neurological Headaches?: No Dizziness?: No  Psychologic Depression?: No Anxiety?: No  Physical Exam: BP 138/76   Pulse 88   Ht 5\' 9"  (1.753 m)  Wt 122.9 kg   BMI 40.02 kg/m    Laboratory Data: Lab Results  Component Value Date   WBC 13.9 (H) 09/02/2018   HGB 15.5 (H) 09/02/2018   HCT 47.5 (H) 09/02/2018   MCV 85.1 09/02/2018   PLT 342 09/02/2018    Lab Results  Component Value Date   CREATININE 1.20 (H) 11/30/2018    No results found for: PSA  No results found for: TESTOSTERONE  No results found for: HGBA1C  Urinalysis    Component Value Date/Time   COLORURINE YELLOW (A) 06/24/2016 1117   APPEARANCEUR Clear 08/04/2017 1010   LABSPEC 1.015 06/24/2016 1117   PHURINE 5.0 06/24/2016 1117   GLUCOSEU Negative 08/04/2017 1010   HGBUR MODERATE (A) 06/24/2016 1117   BILIRUBINUR Negative 08/04/2017 1010   KETONESUR NEGATIVE 06/24/2016 1117   PROTEINUR Negative 08/04/2017 1010   PROTEINUR NEGATIVE  06/24/2016 1117   NITRITE Negative 08/04/2017 1010   NITRITE NEGATIVE 06/24/2016 1117   LEUKOCYTESUR Negative 08/04/2017 1010    Pertinent Imaging:   Assessment & Plan: See in 4 months and hopefully will be able to get more samples  1. Mixed incontinence  - Urinalysis, Complete   No follow-ups on file.  Reece Packer, MD  [place] [address], [place], [place] [address] [phone]

## 2019-02-04 LAB — CULTURE, URINE COMPREHENSIVE

## 2019-06-07 ENCOUNTER — Ambulatory Visit: Payer: Medicaid Other | Admitting: Urology
